# Patient Record
Sex: Male | Born: 1960 | Race: Black or African American | Hispanic: No | Marital: Married | Smoking: Former smoker
Health system: Southern US, Community
[De-identification: ages and names within clinical notes are randomized; demographics above are authoritative.]

## PROBLEM LIST (undated history)

## (undated) DIAGNOSIS — B1920 Unspecified viral hepatitis C without hepatic coma: Secondary | ICD-10-CM

---

## 2012-10-16 DIAGNOSIS — B182 Chronic viral hepatitis C: Secondary | ICD-10-CM | POA: Insufficient documentation

## 2012-10-16 DIAGNOSIS — R7303 Prediabetes: Secondary | ICD-10-CM | POA: Insufficient documentation

## 2013-01-09 DIAGNOSIS — E663 Overweight: Secondary | ICD-10-CM | POA: Insufficient documentation

## 2016-10-19 DIAGNOSIS — B009 Herpesviral infection, unspecified: Secondary | ICD-10-CM | POA: Insufficient documentation

## 2016-11-17 DIAGNOSIS — M7752 Other enthesopathy of left foot: Secondary | ICD-10-CM | POA: Insufficient documentation

## 2016-11-17 DIAGNOSIS — M722 Plantar fascial fibromatosis: Secondary | ICD-10-CM | POA: Insufficient documentation

## 2017-02-17 DIAGNOSIS — H25011 Cortical age-related cataract, right eye: Secondary | ICD-10-CM | POA: Insufficient documentation

## 2017-02-21 DIAGNOSIS — Z961 Presence of intraocular lens: Secondary | ICD-10-CM | POA: Insufficient documentation

## 2017-04-19 DIAGNOSIS — Q078 Other specified congenital malformations of nervous system: Secondary | ICD-10-CM | POA: Insufficient documentation

## 2017-07-22 DIAGNOSIS — Z9842 Cataract extraction status, left eye: Secondary | ICD-10-CM | POA: Insufficient documentation

## 2017-11-02 LAB — PSA: PSA: 0.37

## 2017-11-02 LAB — HM HIV SCREENING LAB: HM HIV SCREENING: NEGATIVE

## 2017-11-02 LAB — HEMOGLOBIN A1C: HEMOGLOBIN A1C: 6.4

## 2017-11-29 ENCOUNTER — Encounter: Payer: Self-pay | Admitting: Family Medicine

## 2017-11-29 ENCOUNTER — Ambulatory Visit (INDEPENDENT_AMBULATORY_CARE_PROVIDER_SITE_OTHER): Payer: BC Managed Care – PPO | Admitting: Family Medicine

## 2017-11-29 VITALS — BP 115/80 | HR 74 | Temp 97.8°F | Resp 16 | Ht 72.5 in | Wt 215.6 lb

## 2017-11-29 DIAGNOSIS — J3089 Other allergic rhinitis: Secondary | ICD-10-CM

## 2017-11-29 DIAGNOSIS — R7303 Prediabetes: Secondary | ICD-10-CM

## 2017-11-29 DIAGNOSIS — I10 Essential (primary) hypertension: Secondary | ICD-10-CM | POA: Insufficient documentation

## 2017-11-29 DIAGNOSIS — Z8619 Personal history of other infectious and parasitic diseases: Secondary | ICD-10-CM | POA: Insufficient documentation

## 2017-11-29 DIAGNOSIS — Z7689 Persons encountering health services in other specified circumstances: Secondary | ICD-10-CM | POA: Diagnosis not present

## 2017-11-29 DIAGNOSIS — J309 Allergic rhinitis, unspecified: Secondary | ICD-10-CM | POA: Insufficient documentation

## 2017-11-29 MED ORDER — AMLODIPINE BESYLATE 10 MG PO TABS: 10.0000 mg | ORAL_TABLET | Freq: Every day | ORAL | 1 refills | Status: DC

## 2017-11-29 NOTE — Assessment & Plan Note (Signed)
Previously elevated A1c up to 6.4, last checked 10/2017 by prior PCP  Plan:  1. Not on any therapy currently  2. Encourage improved lifestyle - low carb, low sugar diet, reduce portion size, continue improving regular exercise 3. Follow-up 4 months for PreDM A1c re-check, discuss goal A1c

## 2017-11-29 NOTE — Progress Notes (Signed)
Subjective:    Patient ID: Kyle Buchanan, male    DOB: Nov 27, 1960, 57 y.o.   MRN: [id number]  Kyle Buchanan is a 57 y.o. male presenting on 11/29/2017 for Establish Care and Hypertension  [facility]  HPI   CHRONIC HTN: Reports that he has a home BP cuff machine, his readings were lower in 120-130s on the medication. Relatively new diagnosis and treatment, he has had some elevated BP in past, now in past 1 month prior [facility] Doctor started him on Amlodipine for his BP. Current Meds - Amlodipine 10mg  daily - no other meds tried   Reports good compliance, took meds today. Tolerating well, w/o complaints. Admits very rare lightheaded or dizzy spell Denies CP, dyspnea, HA, edema  Pre-Diabetes Reports no concerns with "borderline sugar" in past 4 yr ago he went to a nutritionist, and has improved lifestyle and diet, asking if he can get this checked again. Last checked 10/2017 at previous PCP A1c 6.4, previous readings around 6.0 CBGs: Not checking CBG Meds: Never on med Currently not on ACEi / ARB Lifestyle: - Diet (Balanced diet, low salt, granola, yogurt, chicken, salad, he is weaning off soda now, on to more water) - Exercise (He does walking and elliptical machine) Denies hypoglycemia, polyuria, visual changes, numbness or tingling.  PMH - Allergic Rhinosinusitis - on Flonase with good results.  - History of Chronic Hepatitis C, diagnosed initially around 2003 based on chart review with lab results. He has had repeat testing in past.  Health Maintenance:  Colon CA Screening: Last Colonoscopy (done by [facility] on 10/27/10), results with negative result without polyp, good for up to 5-10 years. Currently asymptomatic. No known family history of colon CA.  - Will request copy of last colonoscopy  UTD routine HIV screen, negative 11/02/17 (outside lab - [facility])  UTD Tdap 04/29/2014 - [facility] outside  record.  Prostate CA Screening: Prior PSA / DRE reported normal. [facility] Currently asymptomatic without significant LUTS. No known family history of prostate CA.   Depression screen PHQ 2/9 11/29/2017  Decreased Interest 0  Down, Depressed, Hopeless 0  PHQ - 2 Score 0    History reviewed. No pertinent past medical history. History reviewed. No pertinent surgical history. Social History   Socioeconomic History  . Marital status: Married    Spouse name: Not on file  . Number of children: Not on file  . Years of education: Not on file  . Highest education level: Not on file  Occupational History  . Not on file  Social Needs  . Financial resource strain: Not on file  . Food insecurity:    Worry: Not on file    Inability: Not on file  . Transportation needs:    Medical: Not on file    Non-medical: Not on file  Tobacco Use  . Smoking status: Former Smoker    Packs/day: 1.00    Years: 9.00    Pack years: 9.00    Types: Cigarettes  . Smokeless tobacco: Former Engineer, waterUser  Substance and Sexual Activity  . Alcohol use: Not Currently    Comment: past  . Drug use: Not Currently    Types: Marijuana, Cocaine    Comment: past   . Sexual activity: Not on file  Lifestyle  . Physical activity:    Days per week: Not on file    Minutes per session: Not  on file  . Stress: Not on file  Relationships  . Social connections:    Talks on phone: Not on file    Gets together: Not on file    Attends religious service: Not on file    Active member of club or organization: Not on file    Attends meetings of clubs or organizations: Not on file    Relationship status: Not on file  . Intimate partner violence:    Fear of current or ex partner: Not on file    Emotionally abused: Not on file    Physically abused: Not on file    Forced sexual activity: Not on file  Other Topics Concern  . Not on file  Social History Narrative  . Not on file   Family  History  Problem Relation Age of Onset  . Cancer Father        bone cancer  . Cancer Sister        uncertain type   Current Outpatient Medications on File Prior to Visit  Medication Sig  . fluticasone (FLONASE) 50 MCG/ACT nasal spray Place into the nose.  . Multiple Vitamin (MULTI-VITAMINS) TABS Take by mouth.   No current facility-administered medications on file prior to visit.     Review of Systems Per HPI unless specifically indicated above     Objective:    BP 115/80   Pulse 74   Temp 97.8 F (36.6 C) (Oral)   Resp 16   Ht 6' 0.5" (1.842 m)   Wt 215 lb 9.6 oz (97.8 kg)   BMI 28.84 kg/m   Wt Readings from Last 3 Encounters:  11/29/17 215 lb 9.6 oz (97.8 kg)    Physical Exam  Constitutional: He is oriented to person, place, and time. He appears well-developed and well-nourished. No distress.  Well-appearing, comfortable, cooperative  HENT:  Mouth/Throat: Oropharynx is clear and moist.  Eyes: Conjunctivae are normal. Right eye exhibits no discharge. Left eye exhibits no discharge.  Neck: Normal range of motion.  Cardiovascular: Normal rate, regular rhythm, normal heart sounds and intact distal pulses.  No murmur heard. Pulmonary/Chest: Effort normal and breath sounds normal. No respiratory distress. He has no wheezes. He has no rales.  Musculoskeletal: Normal range of motion. He exhibits no edema.  Neurological: He is alert and oriented to person, place, and time.  Skin: Skin is warm and dry. No rash noted. He is not diaphoretic. No erythema.  Psychiatric: He has a normal mood and affect. His behavior is normal.  Well groomed, good eye contact, normal speech and thoughts  Nursing note and vitals reviewed.  Results for orders placed or performed in visit on 11/29/17  HM HIV SCREENING LAB  Result Value Ref Range   HM HIV Screening Negative - Validated   PSA  Result Value Ref Range   PSA 0.37   Hemoglobin A1c  Result Value Ref Range   Hemoglobin A1C 6.4        Assessment & Plan:   Problem List Items Addressed This Visit    Allergic rhinitis due to allergen    Stable without acute flare History of allergies, using Flonase regularly      Essential hypertension - Primary    Well-controlled HTN, relatively new diagnosis - Home BP readings normal  No known complications - last labs reviewed outside PCP 10/2017    Plan:  1. Continue current BP regimen - Amlodipine 10mg  daily 2. Encourage improved lifestyle - low sodium diet, regular exercise 3.  Continue monitor BP outside office, bring readings to next visit, if persistently >140/90 or new symptoms notify office sooner - also precaution if BP too low < 100 or if persistent dizzy / lightheadedness he may notify us we can consider REDUCE dose by half or 5mg  4. Follow-up 4 months for follow-up BP      Relevant Medications   amLODipine (NORVASC) 10 MG tablet   History of hepatitis C    Based on chart review, not specifically reported by patient. Will review chart and discuss at next visit, may need surveillance in future with [place]      Pre-diabetes    Previously elevated A1c up to 6.4, last checked 10/2017 by prior PCP  Plan:  1. Not on any therapy currently  2. Encourage improved lifestyle - low carb, low sugar diet, reduce portion size, continue improving regular exercise 3. Follow-up 4 months for PreDM A1c re-check, discuss goal A1c        Other Visit Diagnoses    Encounter to establish care with new doctor         Review outside records from [facility] Request Colonoscopy record from [facility] from 2012  Meds ordered this encounter  Medications  . amLODipine (NORVASC) 10 MG tablet    Sig: Take 1 tablet (10 mg total) by mouth daily.    Dispense:  90 tablet    Refill:  1    Follow up plan: Return in about 4 months (around 03/30/2018) for PreDM A1c, HTN med follow-up.  Saralyn Pilar, DO [facility] 11/29/2017,  1:37 PM

## 2017-11-29 NOTE — Assessment & Plan Note (Addendum)
Well-controlled HTN, relatively new diagnosis - Home BP readings normal  No known complications - last labs reviewed outside PCP 10/2017    Plan:  1. Continue current BP regimen - Amlodipine 10mg  daily 2. Encourage improved lifestyle - low sodium diet, regular exercise 3. Continue monitor BP outside office, bring readings to next visit, if persistently >140/90 or new symptoms notify office sooner - also precaution if BP too low < 100 or if persistent dizzy / lightheadedness he may notify us we can consider REDUCE dose by half or 5mg  4. Follow-up 4 months for follow-up BP

## 2017-11-29 NOTE — Patient Instructions (Addendum)
Thank you for coming to the office today.  Keep up the good work.  Refilled Amlodipine 10mg  daily - [facility]  Keep track of BP at home, write down readings, goal is < 140/90 - if too low < 100 and feel Dizzy or Lightheaded, may hold medicine and let me know.  Next visit we will check finger stick blood sugar - A1c - try to keep improving diet, limit soda and increase water. Continue exercise  Please schedule a Follow-up Appointment to: Return in about 4 months (around 03/30/2018) for PreDM A1c, HTN med follow-up.  If you have any other questions or concerns, please feel free to call the office or send a message through MyChart. You may also schedule an earlier appointment if necessary.  Additionally, you may be receiving a survey about your experience at our office within a few days to 1 week by e-mail or mail. We value your feedback.  Saralyn PilarAlexander Tonyetta Berko, DO [facility]

## 2017-11-29 NOTE — Assessment & Plan Note (Signed)
Stable without acute flare History of allergies, using Flonase regularly

## 2017-11-29 NOTE — Assessment & Plan Note (Signed)
Based on chart review, not specifically reported by patient. Will review chart and discuss at next visit, may need surveillance in future with [place]

## 2017-12-04 ENCOUNTER — Telehealth: Payer: Self-pay | Admitting: Family Medicine

## 2017-12-04 NOTE — Telephone Encounter (Signed)
Attempted to call patient today Monday 12/04/17 - phone went straight to voicemail. I saw patient on 11/29/17. The BP medication is Amlodipine 10mg  daily that was recently started by his previous doctor.  Please notify patient:  Yes, most likely ankle swelling is caused by his Amlodipine.  He should STOP med for about 3 days. Then may restart it at HALF dose - cut tablet in HALF for dose of 5mg  to see if this is any better tolerated.  He should keep track of his BP as well. If BP is well controlled at lower dose, goal BP < 140/90, then he should continue this until he follows up with me in March 2020.  He should let us know within 2 weeks if lower dose is better for him, then we can change his rx at pharmacy.  Saralyn PilarAlexander Gaberial Cada, DO [facility] 12/04/2017, 12:31 PM

## 2017-12-04 NOTE — Telephone Encounter (Signed)
Patient advised.

## 2017-12-04 NOTE — Telephone Encounter (Signed)
Pt's ankle is swelling and asked if it could be BP medication [phone]

## 2017-12-18 ENCOUNTER — Telehealth: Payer: Self-pay | Admitting: Family Medicine

## 2017-12-18 NOTE — Telephone Encounter (Signed)
Pt said BP medication is making his feet swell [phone]

## 2017-12-18 NOTE — Telephone Encounter (Signed)
Last seen 11/29/17 in office visit, see note. Also phone call on 12/04/17.  He called on 11/29 with still having foot/ankle swelling. On lower dose Amlodipine from 10 to 5mg .  I advised him now we can STOP Amlodipine due to swelling, and switch med, I offered losartan ARB. He and his wife have concerns that he may not need medication as they think previous doctors rushed the diagnosis of HTN, never had before, dx after 1 visit BP 160/100 in 10/2017.  I advised them that this is reasonable, and I checked his past BP readings almost all were in normal range otherwise, they think his BP was elevated in October 2019 at prior doctor due to coming straight from work/activity.  Now plan as of today 12/18/17 - he will STOP Amlodipine 5mg  daily. Remain off of all anti-HTN meds for now. They will check BP occasionally at pharmacy or home, write down readings, if controlled then no concerns.  He should schedule a sooner follow-up in 6 weeks to re-check BP and check foot swelling. Then we may re-schedule March 2020.  Saralyn PilarAlexander Karamalegos, DO [facility] 12/18/2017, 5:50 PM

## 2018-01-04 ENCOUNTER — Ambulatory Visit: Payer: BC Managed Care – PPO | Admitting: Family Medicine

## 2018-01-08 ENCOUNTER — Encounter: Payer: Self-pay | Admitting: Family Medicine

## 2018-01-08 ENCOUNTER — Ambulatory Visit (INDEPENDENT_AMBULATORY_CARE_PROVIDER_SITE_OTHER): Payer: BC Managed Care – PPO | Admitting: Family Medicine

## 2018-01-08 VITALS — BP 135/72 | HR 71 | Temp 98.4°F | Resp 16 | Ht 72.05 in | Wt 215.6 lb

## 2018-01-08 DIAGNOSIS — M7989 Other specified soft tissue disorders: Secondary | ICD-10-CM

## 2018-01-08 DIAGNOSIS — I10 Essential (primary) hypertension: Secondary | ICD-10-CM | POA: Diagnosis not present

## 2018-01-08 MED ORDER — LISINOPRIL 20 MG PO TABS: 20.0000 mg | ORAL_TABLET | Freq: Every day | ORAL | 1 refills | Status: DC

## 2018-01-08 NOTE — Patient Instructions (Addendum)
Thank you for coming to the office today.  New start Lisinopril 20mg  daily  Remain off Amlodipine  If you get a dry cough or lip or facial swelling - stop med and seek care immediately  Keep checking BP at home  DUE for NON FASTING BLOOD WORK   SCHEDULE "Lab Only" visit in the morning at the clinic for lab draw in 3 WEEKS   - Make sure Lab Only appointment is at about 1 week before your next appointment, so that results will be available  For Lab Results, once available within 2-3 days of blood draw, you can can log in to MyChart online to view your results and a brief explanation. Also, we can discuss results at next follow-up visit.   Please schedule a Follow-up Appointment to: Return in about 3 weeks (around 01/29/2018) for HTN.  If you have any other questions or concerns, please feel free to call the office or send a message through MyChart. You may also schedule an earlier appointment if necessary.  Additionally, you may be receiving a survey about your experience at our office within a few days to 1 week by e-mail or mail. We value your feedback.  Saralyn PilarAlexander Sohil Timko, DO [facility]

## 2018-01-08 NOTE — Progress Notes (Signed)
Subjective:    Patient ID: Kyle Buchanan, male    DOB: Oct 26, 1960, 57 y.o.   MRN: [id number]  Kyle Buchanan is a 57 y.o. male presenting on 01/08/2018 for Hypertension  Accompanied by wife, Casper HarrisonSheila Fickel, who also provides history.  HPI   CHRONIC HTN / Lower Extremity Edema - Last visit with me 11/29/17, for initial visit / establish care, focused on HTN problem, his previous doctor had just initiated him on Amlodipine 10mg  daily before, he developed significant bilateral foot lower extremity swelling and sensitivity, he was advised few days later 11/18 to cut amlodipine in half from 10 to 5mg , this did not help actually swelling increased, then few weeks later approx 12/2 he was advised to STOP Amlodipine completely, see prior notes for background information. - Interval update with now swelling is mostly resolved up to 90% better, but still has some sensitivity in Left foot residually, R foot is back to normal  Home BP readings previously on Amlodipine BP 120-130/70s, off med mostly normal 130/80, occasional up to 140/90  Today he reports interested in new BP medication if needed  Current Meds - None (off Amlodipine)    Denies CP, dyspnea, HA, edema, dizziness / lightheadedness   [facility] 01/08/2018 11/29/2017  Decreased Interest 0 0  Down, Depressed, Hopeless 0 0  PHQ - 2 Score 0 0    Social History   Tobacco Use  . Smoking status: Former Smoker    Packs/day: 1.00    Years: 9.00    Pack years: 9.00    Types: Cigarettes  . Smokeless tobacco: Former Engineer, waterUser  Substance Use Topics  . Alcohol use: Not Currently    Comment: past  . Drug use: Not Currently    Types: Marijuana, Cocaine    Comment: past     Review of Systems Per HPI unless specifically indicated above     Objective:    BP 135/72   Pulse 71   Temp 98.4 F (36.9 C) (Oral)   Resp 16   Ht 6' 0.05" (1.83 m)   Wt 215 lb 9.6 oz (97.8 kg)   BMI 29.20 kg/m   Wt Readings from  Last 3 Encounters:  01/08/18 215 lb 9.6 oz (97.8 kg)  11/29/17 215 lb 9.6 oz (97.8 kg)    Physical Exam Vitals signs and nursing note reviewed.  Constitutional:      General: He is not in acute distress.    Appearance: He is well-developed. He is not diaphoretic.     Comments: Well-appearing, comfortable, cooperative  HENT:     Head: Normocephalic and atraumatic.  Eyes:     General:        Right eye: No discharge.        Left eye: No discharge.     Conjunctiva/sclera: Conjunctivae normal.  Cardiovascular:     Rate and Rhythm: Normal rate and regular rhythm.     Pulses: Normal pulses.     Heart sounds: Normal heart sounds. No murmur.  Pulmonary:     Effort: Pulmonary effort is normal.  Musculoskeletal:        General: No tenderness.     Right lower leg: No edema.     Left lower leg: No edema.  Skin:    General: Skin is warm and dry.     Findings: No erythema or rash.     Comments: Mild callus formation bilateral feet  Neurological:     Mental Status: He is alert  and oriented to person, place, and time.     Comments: Monofilament testing normal bilateral feet  Psychiatric:        Behavior: Behavior normal.     Comments: Well groomed, good eye contact, normal speech and thoughts         Assessment & Plan:   Problem List Items Addressed This Visit    Essential hypertension - Primary    Today normal range BP, off medication - Home BP readings had been improved on Amlodipine, recent mild elevated No known complications - last labs reviewed outside PCP 10/2017 OFF Amlodipine 5-10mg  due to pedal edema    Plan:  1. Remain OFF Amlodipine 2. START new agent - Lisinopril 20mg  daily - reviewed ACEi risk benefit, potential side effect ACEi cough and angioedema 3. Encourage improved lifestyle - low sodium diet, regular exercise 4. Continue monitor BP outside office, bring readings to next visit, if persistently >140/90 or new symptoms notify office sooner 5. Follow-up 2-4  weeks for chemistry lab for Cr / K - then return 03/2018 for follow-up - he may keep apt for 01/29/18 if BP elevated - would likely increase ACEi vs add Thiazide if edema returns. Otherwise cancel apt 01/29/18 for office visit       Relevant Medications   lisinopril (PRINIVIL,ZESTRIL) 20 MG tablet   Other Relevant Orders   BASIC METABOLIC PANEL WITH GFR    Other Visit Diagnoses    Foot swelling         RESOLVED edema off Amlodipine - Still improving sensation of L foot - follow-up See A&P above  Meds ordered this encounter  Medications  . lisinopril (PRINIVIL,ZESTRIL) 20 MG tablet    Sig: Take 1 tablet (20 mg total) by mouth daily.    Dispense:  90 tablet    Refill:  1    Follow up plan: Return in about 3 weeks (around 01/29/2018) for HTN.  Future labs ordered for 01/29/18  Saralyn PilarAlexander Karamalegos, DO [facility] 01/08/2018, 9:13 AM

## 2018-01-08 NOTE — Assessment & Plan Note (Addendum)
Today normal range BP, off medication - Home BP readings had been improved on Amlodipine, recent mild elevated No known complications - last labs reviewed outside PCP 10/2017 OFF Amlodipine 5-10mg  due to pedal edema    Plan:  1. Remain OFF Amlodipine 2. START new agent - Lisinopril 20mg  daily - reviewed ACEi risk benefit, potential side effect ACEi cough and angioedema 3. Encourage improved lifestyle - low sodium diet, regular exercise 4. Continue monitor BP outside office, bring readings to next visit, if persistently >140/90 or new symptoms notify office sooner 5. Follow-up 2-4 weeks for chemistry lab for Cr / K - then return 03/2018 for follow-up - he may keep apt for 01/29/18 if BP elevated - would likely increase ACEi vs add Thiazide if edema returns. Otherwise cancel apt 01/29/18 for office visit

## 2018-01-15 ENCOUNTER — Other Ambulatory Visit: Payer: Self-pay

## 2018-01-15 NOTE — Telephone Encounter (Signed)
[facility] [place]

## 2018-01-16 MED ORDER — FLUTICASONE PROPIONATE 50 MCG/ACT NA SUSP: 2.0000 | Freq: Every day | NASAL | 6 refills | Status: DC

## 2018-01-16 NOTE — Addendum Note (Signed)
Addended by: Elvina MattesPATEL, Kandra Graven D on: 01/16/2018 09:27 AM   Modules accepted: Orders

## 2018-01-29 ENCOUNTER — Other Ambulatory Visit: Payer: BC Managed Care – PPO

## 2018-01-29 ENCOUNTER — Ambulatory Visit: Payer: BC Managed Care – PPO | Admitting: Family Medicine

## 2018-01-29 DIAGNOSIS — I10 Essential (primary) hypertension: Secondary | ICD-10-CM

## 2018-01-30 LAB — BASIC METABOLIC PANEL WITH GFR
BUN: 21 mg/dL (ref 7–25)
CO2: 24 mmol/L (ref 20–32)
Calcium: 9.4 mg/dL (ref 8.6–10.3)
Chloride: 106 mmol/L (ref 98–110)
Creat: 0.89 mg/dL (ref 0.70–1.33)
GFR, EST AFRICAN AMERICAN: 109 mL/min/{1.73_m2} (ref >=60)
GFR, Est Non African American: 94 mL/min/{1.73_m2} (ref >=60)
Glucose, Bld: 119 mg/dL (ref 65–139)
Potassium: 4.4 mmol/L (ref 3.5–5.3)
Sodium: 136 mmol/L (ref 135–146)

## 2018-02-15 ENCOUNTER — Telehealth: Payer: Self-pay | Admitting: Family Medicine

## 2018-02-15 NOTE — Telephone Encounter (Signed)
Pt asked about cutting back the dosage of lisinopril [phone]

## 2018-02-16 NOTE — Telephone Encounter (Signed)
Spoke with patient on why he wanted to cut back on lisinopril.  He said he has been having nausea.  However, his BP reading yesterday was 120/71.  He then said he thinks it might be the Flonase.  He take Flonase around 4:00om. Please advise

## 2018-02-16 NOTE — Telephone Encounter (Signed)
Please notify patient  I think it may be Flonase as well.  He should STOP Flonase to see if nausea improves.  He may continue his Lisinopril for now. It looks like he missed last apt in 01/2018. He is scheduled for Monday. I will discuss Lisinopril with him further on Monday.  Saralyn Pilar, DO [facility] 02/16/2018, 1:56 PM

## 2018-02-16 NOTE — Telephone Encounter (Signed)
LM for patient to call to get more details

## 2018-02-16 NOTE — Telephone Encounter (Signed)
Patient notified

## 2018-02-19 ENCOUNTER — Encounter: Payer: Self-pay | Admitting: Family Medicine

## 2018-02-19 ENCOUNTER — Ambulatory Visit (INDEPENDENT_AMBULATORY_CARE_PROVIDER_SITE_OTHER): Payer: BC Managed Care – PPO | Admitting: Family Medicine

## 2018-02-19 DIAGNOSIS — A6000 Herpesviral infection of urogenital system, unspecified: Secondary | ICD-10-CM

## 2018-02-19 MED ORDER — VALACYCLOVIR HCL 1 G PO TABS: 1000.0000 mg | ORAL_TABLET | Freq: Every day | ORAL | 1 refills | Status: DC | PRN

## 2018-02-19 NOTE — Progress Notes (Signed)
Subjective:    Patient ID: Kyle Buchanan, male    DOB: [birth year], 59 y.o.   MRN: [id number]  BARTT HA is a 58 y.o. male presenting on 02/19/2018 for Rash (onset 2 weeks)   HPI   Recurrent Genital HSV Reports chronic problem now for years with occasional outbreak with genital lesion / rash about 1-2 x a year approx, different locations on penis will have vesicle vs blister that develop and heals over 2 weeks usually, in past he took acyclovir PRN only with flare, he has been out of this now for while, would like to restart this med PRN. - Current flare has been present 2 weeks now, with similar skin blister rash that is healing and drying up. He has not taken any medicine for it recently - Denies any drainage, redness swelling ulceration or other problem   Depression screen [facility] 2/9 02/19/2018 01/08/2018 11/29/2017  Decreased Interest 0 0 0  Down, Depressed, Hopeless 0 0 0  PHQ - 2 Score 0 0 0    Social History   Tobacco Use  . Smoking status: Former Smoker    Packs/day: 1.00    Years: 9.00    Pack years: 9.00    Types: Cigarettes  . Smokeless tobacco: Former Engineer, water Use Topics  . Alcohol use: Not Currently    Comment: past  . Drug use: Not Currently    Types: Marijuana, Cocaine    Comment: past     Review of Systems Per HPI unless specifically indicated above     Objective:    BP 118/81   Pulse 73   Temp 97.7 F (36.5 C) (Oral)   Resp 16   Ht 6' (1.829 m)   Wt 214 lb (97.1 kg)   BMI 29.02 kg/m   Wt Readings from Last 3 Encounters:  02/19/18 214 lb (97.1 kg)  01/08/18 215 lb 9.6 oz (97.8 kg)  11/29/17 215 lb 9.6 oz (97.8 kg)    Physical Exam Vitals signs and nursing note reviewed.  Constitutional:      General: He is not in acute distress.    Appearance: He is well-developed. He is not diaphoretic.     Comments: Well-appearing, comfortable, cooperative  Cardiovascular:     Rate and Rhythm: Normal rate.  Pulmonary:     Effort:  Pulmonary effort is normal.  Genitourinary:    Comments: Male genital exam - penis with localized area on lower aspect of shaft with 1-2 cm now healing dried vesicular lesion without active vesicle or blister, has slight ulceration of skin that is healing as well Skin:    General: Skin is warm and dry.     Findings: No erythema or rash.  Neurological:     Mental Status: He is alert and oriented to person, place, and time.  Psychiatric:        Behavior: Behavior normal.     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 valACYclovir (VALTREX) 1000 MG tablet    Sig: Take 1 tablet (1,000 mg total) by mouth daily as needed. For flare up for 5 days then stop    Dispense:  20 tablet    Refill:  1     Follow up plan: Return if symptoms worsen or fail to improve.   Saralyn Pilar, DO [facility] 02/19/2018, 4:32 PM

## 2018-02-19 NOTE — Patient Instructions (Addendum)
Thank you for coming to the office today.  For recurrent rash you can start Valtrex take one pill every day for 5 days if develop first onset rash - if does not resolve can repeat again within 5 days.  If you have too many flare up >4 per year then notify office.  Please schedule a Follow-up Appointment to: Return if symptoms worsen or fail to improve.  If you have any other questions or concerns, please feel free to call the office or send a message through MyChart. You may also schedule an earlier appointment if necessary.  Additionally, you may be receiving a survey about your experience at our office within a few days to 1 week by e-mail or mail. We value your feedback.  Saralyn Pilar, DO [facility], [place]

## 2018-03-28 ENCOUNTER — Ambulatory Visit: Payer: BC Managed Care – PPO | Admitting: Family Medicine

## 2018-04-10 ENCOUNTER — Other Ambulatory Visit: Payer: Self-pay | Admitting: Family Medicine

## 2018-04-10 DIAGNOSIS — R7303 Prediabetes: Secondary | ICD-10-CM

## 2018-04-11 ENCOUNTER — Other Ambulatory Visit: Payer: Self-pay

## 2018-04-11 ENCOUNTER — Other Ambulatory Visit: Payer: BC Managed Care – PPO

## 2018-04-11 ENCOUNTER — Ambulatory Visit: Payer: BC Managed Care – PPO | Admitting: Family Medicine

## 2018-04-11 DIAGNOSIS — R7303 Prediabetes: Secondary | ICD-10-CM

## 2018-04-12 LAB — HEMOGLOBIN A1C
Hgb A1c MFr Bld: 6.3 % of total Hgb — ABNORMAL HIGH (ref <5.7)
MEAN PLASMA GLUCOSE: 134 (calc)
eAG (mmol/L): 7.4 (calc)

## 2018-04-16 ENCOUNTER — Ambulatory Visit (INDEPENDENT_AMBULATORY_CARE_PROVIDER_SITE_OTHER): Payer: BC Managed Care – PPO | Admitting: Family Medicine

## 2018-04-16 ENCOUNTER — Other Ambulatory Visit: Payer: Self-pay | Admitting: Family Medicine

## 2018-04-16 ENCOUNTER — Encounter: Payer: Self-pay | Admitting: Family Medicine

## 2018-04-16 ENCOUNTER — Other Ambulatory Visit: Payer: Self-pay

## 2018-04-16 DIAGNOSIS — R202 Paresthesia of skin: Secondary | ICD-10-CM

## 2018-04-16 DIAGNOSIS — R7303 Prediabetes: Secondary | ICD-10-CM

## 2018-04-16 DIAGNOSIS — R200 Anesthesia of skin: Secondary | ICD-10-CM

## 2018-04-16 DIAGNOSIS — Z0000 Encounter for general adult medical examination without abnormal findings: Secondary | ICD-10-CM

## 2018-04-16 DIAGNOSIS — I10 Essential (primary) hypertension: Secondary | ICD-10-CM

## 2018-04-16 NOTE — Assessment & Plan Note (Signed)
Improved A1c from 6.4 down to 6.3 now, improved lifestyle diet  Plan:  1. Not on any therapy currently  2. Encourage improved lifestyle - low carb, low sugar diet, reduce portion size, continue improving regular exercise 3. Follow-up 4 months Annual phys and labs

## 2018-04-16 NOTE — Progress Notes (Signed)
Virtual Visit via Telephone The purpose of this virtual visit is to provide medical care while limiting exposure to the novel coronavirus (COVID19) for both patient and office staff.  Consent was obtained for phone visit:  Yes.   Answered questions that patient had about telehealth interaction:  Yes.   I discussed the limitations, risks, security and privacy concerns of performing an evaluation and management service by telephone. I also discussed with the patient that there may be a patient responsible charge related to this service. The patient expressed understanding and agreed to proceed.  Patient Location: Home Provider Location: [facility] (Office)  ---------------------------------------------------------------------- CC: Pre-Diabetes, Left Thumb sensation  S: Reviewed CMA telephone note below. I have called patient and gathered additional HPI as follows:  Pre-Diabetes Last visit 11/2017 for same complaint, has persistent issue with A1c >6 in past, last was 6.4, now recently he had lab draw for A1c down to 6.3 CBGs: Not checking CBG Meds: Never on med Currently not on ACEi / ARB Lifestyle: - Diet (Improved diet again with reduced sugar, focus on healthier options, low carb, chicken, salad, granola) - Exercise (He does walking and elliptical machine) Denies hypoglycemia, polyuria, visual changes, numbness or tingling.  Left Thumb, abnormal sensation Reports symptoms started 2-3 week ago, without injury. He reports new problem with episodic "weird" sensation within thumb. Not tried any medicine or wrist splint. He attributes some of this to wearing specific smaller gloves at work may be compressing his thumb He denies any pain or swelling thumb.  Denies any fevers, chills, sweats, body ache, cough, shortness of breath, headache, abdominal pain, diarrhea  History reviewed. No pertinent past medical history. Social History   Tobacco Use  . Smoking status: Former  Smoker    Packs/day: 1.00    Years: 9.00    Pack years: 9.00    Types: Cigarettes  . Smokeless tobacco: Former Engineer, water Use Topics  . Alcohol use: Not Currently    Comment: past  . Drug use: Not Currently    Types: Marijuana, Cocaine    Comment: past     Current Outpatient Medications:  .  lisinopril (PRINIVIL,ZESTRIL) 20 MG tablet, Take 1 tablet (20 mg total) by mouth daily., Disp: 90 tablet, Rfl: 1 .  Multiple Vitamin (MULTI-VITAMINS) TABS, Take by mouth., Disp: , Rfl:  .  valACYclovir (VALTREX) 1000 MG tablet, Take 1 tablet (1,000 mg total) by mouth daily as needed. For flare up for 5 days then stop (Patient not taking: Reported on 04/16/2018), Disp: 20 tablet, Rfl: 1  Depression screen [facility] 2/9 04/16/2018 02/19/2018 01/08/2018  Decreased Interest 0 0 0  Down, Depressed, Hopeless 0 0 0  PHQ - 2 Score 0 0 0    No flowsheet data found.  -------------------------------------------------------------------------- O: No physical exam performed due to remote telephone encounter.  Lab results reviewed.  Recent Labs    11/02/17 04/11/18 0807  HGBA1C 6.4 6.3*    Recent Results (from the past 2160 hour(s))  BASIC METABOLIC PANEL WITH GFR     Status: None   Collection Time: 01/29/18  8:11 AM  Result Value Ref Range   Glucose, Bld 119 65 - 139 mg/dL    Comment: .        Non-fasting reference interval .    BUN 21 7 - 25 mg/dL   Creat 3.81 8.29 - 9.37 mg/dL    Comment: For patients >17 years of age, the reference limit for Creatinine is approximately 13% higher for  people identified as African-American. .    GFR, Est Non African American 94 > OR = 60 mL/min/1.61m2   GFR, Est African American 109 > OR = 60 mL/min/1.36m2   BUN/Creatinine Ratio NOT APPLICABLE 6 - 22 (calc)   Sodium 136 135 - 146 mmol/L   Potassium 4.4 3.5 - 5.3 mmol/L   Chloride 106 98 - 110 mmol/L   CO2 24 20 - 32 mmol/L   Calcium 9.4 8.6 - 10.3 mg/dL  Hemoglobin Z6X     Status: Abnormal    Collection Time: 04/11/18  8:07 AM  Result Value Ref Range   Hgb A1c MFr Bld 6.3 (H) <5.7 % of total Hgb    Comment: For someone without known diabetes, a hemoglobin  A1c value between 5.7% and 6.4% is consistent with prediabetes and should be confirmed with a  follow-up test. . For someone with known diabetes, a value <7% indicates that their diabetes is well controlled. A1c targets should be individualized based on duration of diabetes, age, comorbid conditions, and other considerations. . This assay result is consistent with an increased risk of diabetes. . Currently, no consensus exists regarding use of hemoglobin A1c for diagnosis of diabetes for children. .    Mean Plasma Glucose 134 (calc)   eAG (mmol/L) 7.4 (calc)    -------------------------------------------------------------------------- A&P:  Problem List Items Addressed This Visit    Pre-diabetes - Primary    Improved A1c from 6.4 down to 6.3 now, improved lifestyle diet  Plan:  1. Not on any therapy currently  2. Encourage improved lifestyle - low carb, low sugar diet, reduce portion size, continue improving regular exercise 3. Follow-up 4 months Annual phys and labs       Other Visit Diagnoses    Numbness and tingling of left thumb        Uncertain etiology, may be compression with gloves at work No pain or other associated features. No injury  Reassurance Avoid compression/overuse May use ice / compression elevation Try Thumb Spica Splint during work and at night Follow-up PRN  No orders of the defined types were placed in this encounter.   Follow-up: - Return in 4 months for annual physical - Future labs ordered for 08/13/18  Patient verbalizes understanding with the above medical recommendations including the limitation of remote medical advice.  Specific follow-up and call-back criteria were given for patient to follow-up or seek medical care more urgently if needed.   - Time spent in  direct consultation with patient on phone: 11 minutes  Saralyn Pilar, DO [facility] 04/16/2018, 9:05 AM

## 2018-04-16 NOTE — Patient Instructions (Addendum)
Recent Labs    11/02/17 04/11/18 0807  HGBA1C 6.4 6.3*   Try Thumb Spica Splint for left thumb - during work and overnight  DUE for FASTING BLOOD WORK (no food or drink after midnight before the lab appointment, only water or coffee without cream/sugar on the morning of)  SCHEDULE "Lab Only" visit in the morning at the clinic for lab draw in 4 MONTHS   - Make sure Lab Only appointment is at about 1 week before your next appointment, so that results will be available  For Lab Results, once available within 2-3 days of blood draw, you can can log in to MyChart online to view your results and a brief explanation. Also, we can discuss results at next follow-up visit.   If you have any other questions or concerns, please feel free to call the office or send a message through MyChart. You may also schedule an earlier appointment if necessary.  Additionally, you may be receiving a survey about your experience at our office within a few days to 1 week by e-mail or mail. We value your feedback.  Saralyn Pilar, DO [facility], [place]

## 2018-06-18 ENCOUNTER — Other Ambulatory Visit: Payer: Self-pay | Admitting: Family Medicine

## 2018-06-18 DIAGNOSIS — I10 Essential (primary) hypertension: Secondary | ICD-10-CM

## 2018-08-13 ENCOUNTER — Other Ambulatory Visit: Payer: BC Managed Care – PPO

## 2018-08-13 ENCOUNTER — Other Ambulatory Visit: Payer: Self-pay

## 2018-08-13 DIAGNOSIS — R7303 Prediabetes: Secondary | ICD-10-CM

## 2018-08-13 DIAGNOSIS — Z0000 Encounter for general adult medical examination without abnormal findings: Secondary | ICD-10-CM

## 2018-08-13 DIAGNOSIS — I10 Essential (primary) hypertension: Secondary | ICD-10-CM

## 2018-08-14 LAB — COMPLETE METABOLIC PANEL WITH GFR
AG Ratio: 1.5 (calc) (ref 1.0–2.5)
ALT: 34 U/L (ref 9–46)
AST: 31 U/L (ref 10–35)
Albumin: 4.4 g/dL (ref 3.6–5.1)
Alkaline phosphatase (APISO): 45 U/L (ref 35–144)
BUN: 20 mg/dL (ref 7–25)
CO2: 24 mmol/L (ref 20–32)
Calcium: 9.2 mg/dL (ref 8.6–10.3)
Chloride: 102 mmol/L (ref 98–110)
Creat: 1.08 mg/dL (ref 0.70–1.33)
GFR, Est African American: 87 mL/min/{1.73_m2} (ref >=60)
GFR, Est Non African American: 75 mL/min/{1.73_m2} (ref >=60)
Globulin: 2.9 g/dL (calc) (ref 1.9–3.7)
Glucose, Bld: 115 mg/dL — ABNORMAL HIGH (ref 65–99)
Potassium: 4.4 mmol/L (ref 3.5–5.3)
Sodium: 137 mmol/L (ref 135–146)
Total Bilirubin: 0.6 mg/dL (ref 0.2–1.2)
Total Protein: 7.3 g/dL (ref 6.1–8.1)

## 2018-08-14 LAB — CBC WITH DIFFERENTIAL/PLATELET
Absolute Monocytes: 353 cells/uL (ref 200–950)
Basophils Absolute: 20 cells/uL (ref 0–200)
Basophils Relative: 0.6 %
Eosinophils Absolute: 294 cells/uL (ref 15–500)
Eosinophils Relative: 8.9 %
HCT: 43.8 % (ref 38.5–50.0)
Hemoglobin: 14.2 g/dL (ref 13.2–17.1)
Lymphs Abs: 1442 cells/uL (ref 850–3900)
MCH: 27.2 pg (ref 27.0–33.0)
MCHC: 32.4 g/dL (ref 32.0–36.0)
MCV: 83.9 fL (ref 80.0–100.0)
MPV: 9 fL (ref 7.5–12.5)
Monocytes Relative: 10.7 %
Neutro Abs: 1191 cells/uL — ABNORMAL LOW (ref 1500–7800)
Neutrophils Relative %: 36.1 %
Platelets: 202 10*3/uL (ref 140–400)
RBC: 5.22 10*6/uL (ref 4.20–5.80)
RDW: 13.4 % (ref 11.0–15.0)
Total Lymphocyte: 43.7 %
WBC: 3.3 10*3/uL — ABNORMAL LOW (ref 3.8–10.8)

## 2018-08-14 LAB — LIPID PANEL
Cholesterol: 167 mg/dL (ref <200)
HDL: 59 mg/dL (ref >=40)
LDL Cholesterol (Calc): 88 mg/dL (calc)
Non-HDL Cholesterol (Calc): 108 mg/dL (calc) (ref <130)
Total CHOL/HDL Ratio: 2.8 (calc) (ref <5.0)
Triglycerides: 103 mg/dL (ref <150)

## 2018-08-14 LAB — PSA: PSA: 0.5 ng/mL (ref <=4.0)

## 2018-08-14 LAB — HEMOGLOBIN A1C
Hgb A1c MFr Bld: 6.2 % of total Hgb — ABNORMAL HIGH (ref <5.7)
Mean Plasma Glucose: 131 (calc)
eAG (mmol/L): 7.3 (calc)

## 2018-08-17 ENCOUNTER — Encounter: Payer: BC Managed Care – PPO | Admitting: Family Medicine

## 2018-08-24 ENCOUNTER — Ambulatory Visit (INDEPENDENT_AMBULATORY_CARE_PROVIDER_SITE_OTHER): Payer: BC Managed Care – PPO | Admitting: Family Medicine

## 2018-08-24 ENCOUNTER — Other Ambulatory Visit: Payer: Self-pay

## 2018-08-24 ENCOUNTER — Encounter: Payer: Self-pay | Admitting: Family Medicine

## 2018-08-24 VITALS — BP 129/81 | HR 62 | Temp 98.0°F | Resp 16 | Ht 72.0 in | Wt 216.8 lb

## 2018-08-24 DIAGNOSIS — R7303 Prediabetes: Secondary | ICD-10-CM | POA: Diagnosis not present

## 2018-08-24 DIAGNOSIS — Z1211 Encounter for screening for malignant neoplasm of colon: Secondary | ICD-10-CM | POA: Diagnosis not present

## 2018-08-24 DIAGNOSIS — Z0000 Encounter for general adult medical examination without abnormal findings: Secondary | ICD-10-CM

## 2018-08-24 DIAGNOSIS — I10 Essential (primary) hypertension: Secondary | ICD-10-CM | POA: Diagnosis not present

## 2018-08-24 NOTE — Assessment & Plan Note (Signed)
Controlled BP Home BP normal No known complications OFF Amlodipine 5-10mg  due to pedal edema    Plan:  1. Continue Lisinopril 20mg  daily 2. Encourage improved lifestyle - low sodium diet, regular exercise 3. Continue monitor BP outside office, bring readings to next visit, if persistently >140/90 or new symptoms notify office sooner

## 2018-08-24 NOTE — Patient Instructions (Addendum)
Thank you for coming to the office today.  1. Chemistry - Normal results, including electrolytes, kidney and liver function.  - Mild elevated fasting sugar.   2. Hemoglobin A1c (Pre-Diabetes) - 6.2, improved from prior 6.3-6.4   3. PSA Prostate Cancer Screening - 0.5, negative.   4. CBC Blood Counts - Mild low WBC 3.3. no comparison. No anemia.   5. Cholesterol - Normal cholesterol results.   Colon Cancer Screening: - For all adults age 59+ routine colon cancer screening is highly recommended.     - Recent guidelines from [place] recommend starting age of 16 - Early detection of colon cancer is important, because often there are no warning signs or symptoms, also if found early usually it can be cured. Late stage is hard to treat.  - If you are not interested in Colonoscopy screening (if done and normal you could be cleared for 5 to 10 years until next due), then Cologuard is an excellent alternative for screening test for Colon Cancer. It is highly sensitive for detecting DNA of colon cancer from even the earliest stages. Also, there is NO bowel prep required. - If Cologuard is NEGATIVE, then it is good for 3 years before next due - If Cologuard is POSITIVE, then it is strongly advised to get a Colonoscopy, which allows the GI doctor to locate the source of the cancer or polyp (even very early stage) and treat it by removing it. ------------------------- If you would like to proceed with Cologuard (stool DNA test)  Ordered today  Follow instructions to collect sample, you may call the company for any help or questions, 24/7 telephone support at [phone].    Please schedule a Follow-up Appointment to: Return in about 6 months (around 02/24/2019) for 6 month follow-up PreDM A1c, HTN.  If you have any other questions or concerns, please feel free to call the office or send a message through [place]. You may also schedule an earlier appointment if  necessary.  Additionally, you may be receiving a survey about your experience at our office within a few days to 1 week by e-mail or mail. We value your feedback.  Kyle Putnam, DO [place]

## 2018-08-24 NOTE — Progress Notes (Signed)
Subjective:    Patient ID: Kyle Buchanan, male    DOB: [birth year], 59 y.o.   MRN: [id number]  Kyle Buchanan is a 58 y.o. male presenting on 08/24/2018 for Annual Exam   HPI   Here for Annual Physical and Lab Review.  Pre-Diabetes Last 03/2018, A1c now down to 6.2, from 6.3-6.4 Improving lifestyle CBGs:Not checking CBG Meds:Never on med Currentlynot onACEi / ARB Lifestyle: - Weight stable, plus minus 1 lb - Diet (Improved diet, healthy options) - Exercise (He does walking and elliptical machine and other exercises regularly) Denies hypoglycemia, polyuria, visual changes, numbness or tingling.  CHRONIC HTN / Lower Extremity Edema Previously on Amlodipine but it caused LE edema. Now off med doing well. See prior notes for background information. Today he is doing well, BP is controlled on Lisinopril Taking Lisinopril 20mg  daily Reports good compliance, took meds today. Tolerating well, w/o complaints. Denies CP, dyspnea, HA, edema, dizziness / lightheadedness  HYPERLIPIDEMIA: - Reports no concerns. Last lipid panel 07/2018, controlled on lifestyle   Health Maintenance:  Colon CA Screening: Last Colonoscopy (done by [facility] on 10/27/10), results with negative result without polyp, good for up to 5-10 years. Currently asymptomatic. No known family history of colon CA.  - Order Cologuard today.  UTD routine HIV screen, negative 11/02/17 (outside lab - [place])  UTD Tdap 04/29/2014 - [place] outside record.  Prostate CA Screening: Prior PSA / DRE reported normal. Last PSA 0.5 (07/2018). Currently asymptomatic without significant LUTS. No known family history of prostate CA.   Depression screen [facility] 2/9 08/24/2018 04/16/2018 02/19/2018  Decreased Interest 0 0 0  Down, Depressed, Hopeless 0 0 0  PHQ - 2 Score 0 0 0    History reviewed. No pertinent past medical history. History reviewed. No pertinent surgical history. Social History   Socioeconomic  History  . Marital status: Married    Spouse name: Not on file  . Number of children: Not on file  . Years of education: Not on file  . Highest education level: Not on file  Occupational History  . Not on file  Social Needs  . Financial resource strain: Not on file  . Food insecurity    Worry: Not on file    Inability: Not on file  . Transportation needs    Medical: Not on file    Non-medical: Not on file  Tobacco Use  . Smoking status: Former Smoker    Packs/day: 1.00    Years: 9.00    Pack years: 9.00    Types: Cigarettes  . Smokeless tobacco: Former Network engineer and Sexual Activity  . Alcohol use: Not Currently    Comment: past  . Drug use: Not Currently    Types: Marijuana, Cocaine    Comment: past   . Sexual activity: Not on file  Lifestyle  . Physical activity    Days per week: Not on file    Minutes per session: Not on file  . Stress: Not on file  Relationships  . Social Herbalist on phone: Not on file    Gets together: Not on file    Attends religious service: Not on file    Active member of club or organization: Not on file    Attends meetings of clubs or organizations: Not on file    Relationship status: Not on file  . Intimate partner violence    Fear of current or ex partner: Not on file  Emotionally abused: Not on file    Physically abused: Not on file    Forced sexual activity: Not on file  Other Topics Concern  . Not on file  Social History Narrative  . Not on file   Family History  Problem Relation Age of Onset  . Cancer Father        bone cancer  . Cancer Sister        uncertain type   Current Outpatient Medications on File Prior to Visit  Medication Sig  . lisinopril (ZESTRIL) 20 MG tablet TAKE 1 TABLET BY MOUTH EVERY DAY  . Multiple Vitamin (MULTI-VITAMINS) TABS Take by mouth.  . valACYclovir (VALTREX) 1000 MG tablet Take 1 tablet (1,000 mg total) by mouth daily as needed. For flare up for 5 days then stop   No  current facility-administered medications on file prior to visit.     Review of Systems  Constitutional: Negative for activity change, appetite change, chills, diaphoresis, fatigue and fever.  HENT: Negative for congestion and hearing loss.   Eyes: Negative for visual disturbance.  Respiratory: Negative for apnea, cough, chest tightness, shortness of breath and wheezing.   Cardiovascular: Negative for chest pain, palpitations and leg swelling.  Gastrointestinal: Negative for abdominal pain, anal bleeding, blood in stool, constipation, diarrhea, nausea and vomiting.  Endocrine: Negative for cold intolerance.  Genitourinary: Negative for decreased urine volume, difficulty urinating, dysuria, frequency and hematuria.  Musculoskeletal: Negative for arthralgias, back pain and neck pain.  Skin: Negative for rash.  Allergic/Immunologic: Negative for environmental allergies.  Neurological: Negative for dizziness, weakness, light-headedness, numbness and headaches.  Hematological: Negative for adenopathy.  Psychiatric/Behavioral: Negative for behavioral problems, dysphoric mood and sleep disturbance. The patient is not nervous/anxious.    Per HPI unless specifically indicated above      Objective:    BP 129/81   Pulse 62   Temp 98 F (36.7 C) (Oral)   Resp 16   Ht 6' (1.829 m)   Wt 216 lb 12.8 oz (98.3 kg)   BMI 29.40 kg/m   Wt Readings from Last 3 Encounters:  08/24/18 216 lb 12.8 oz (98.3 kg)  02/19/18 214 lb (97.1 kg)  01/08/18 215 lb 9.6 oz (97.8 kg)    Physical Exam Vitals signs and nursing note reviewed.  Constitutional:      General: He is not in acute distress.    Appearance: He is well-developed. He is not diaphoretic.     Comments: Well-appearing, comfortable, cooperative  HENT:     Head: Normocephalic and atraumatic.  Eyes:     General:        Right eye: No discharge.        Left eye: No discharge.     Conjunctiva/sclera: Conjunctivae normal.     Pupils: Pupils  are equal, round, and reactive to light.  Neck:     Musculoskeletal: Normal range of motion and neck supple.     Thyroid: No thyromegaly.  Cardiovascular:     Rate and Rhythm: Normal rate and regular rhythm.     Heart sounds: Normal heart sounds. No murmur.  Pulmonary:     Effort: Pulmonary effort is normal. No respiratory distress.     Breath sounds: Normal breath sounds. No wheezing or rales.  Abdominal:     General: Bowel sounds are normal. There is no distension.     Palpations: Abdomen is soft. There is no mass.     Tenderness: There is no abdominal tenderness.  Musculoskeletal: Normal range  of motion.        General: No tenderness.     Comments: Upper / Lower Extremities: - Normal muscle tone, strength bilateral upper extremities 5/5, lower extremities 5/5  Lymphadenopathy:     Cervical: No cervical adenopathy.  Skin:    General: Skin is warm and dry.     Findings: No erythema or rash.  Neurological:     Mental Status: He is alert and oriented to person, place, and time.     Comments: Distal sensation intact to light touch all extremities  Psychiatric:        Behavior: Behavior normal.     Comments: Well groomed, good eye contact, normal speech and thoughts    Results for orders placed or performed in visit on 08/13/18  Lipid panel  Result Value Ref Range   Cholesterol 167 <200 mg/dL   HDL 59 > OR = 40 mg/dL   Triglycerides 161103 <096<150 mg/dL   LDL Cholesterol (Calc) 88 mg/dL (calc)   Total CHOL/HDL Ratio 2.8 <5.0 (calc)   Non-HDL Cholesterol (Calc) 108 <130 mg/dL (calc)  COMPLETE METABOLIC PANEL WITH GFR  Result Value Ref Range   Glucose, Bld 115 (H) 65 - 99 mg/dL   BUN 20 7 - 25 mg/dL   Creat 0.451.08 4.090.70 - 8.111.33 mg/dL   GFR, Est Non African American 75 > OR = 60 mL/min/1.3673m2   GFR, Est African American 87 > OR = 60 mL/min/1.1673m2   BUN/Creatinine Ratio NOT APPLICABLE 6 - 22 (calc)   Sodium 137 135 - 146 mmol/L   Potassium 4.4 3.5 - 5.3 mmol/L   Chloride 102 98 - 110  mmol/L   CO2 24 20 - 32 mmol/L   Calcium 9.2 8.6 - 10.3 mg/dL   Total Protein 7.3 6.1 - 8.1 g/dL   Albumin 4.4 3.6 - 5.1 g/dL   Globulin 2.9 1.9 - 3.7 g/dL (calc)   AG Ratio 1.5 1.0 - 2.5 (calc)   Total Bilirubin 0.6 0.2 - 1.2 mg/dL   Alkaline phosphatase (APISO) 45 35 - 144 U/L   AST 31 10 - 35 U/L   ALT 34 9 - 46 U/L  PSA  Result Value Ref Range   PSA 0.5 < OR = 4.0 ng/mL  CBC with Differential/Platelet  Result Value Ref Range   WBC 3.3 (L) 3.8 - 10.8 Thousand/uL   RBC 5.22 4.20 - 5.80 Million/uL   Hemoglobin 14.2 13.2 - 17.1 g/dL   HCT 91.443.8 78.238.5 - 95.650.0 %   MCV 83.9 80.0 - 100.0 fL   MCH 27.2 27.0 - 33.0 pg   MCHC 32.4 32.0 - 36.0 g/dL   RDW 21.313.4 08.611.0 - 57.815.0 %   Platelets 202 140 - 400 Thousand/uL   MPV 9.0 7.5 - 12.5 fL   Neutro Abs 1,191 (L) 1,500 - 7,800 cells/uL   Lymphs Abs 1,442 850 - 3,900 cells/uL   Absolute Monocytes 353 200 - 950 cells/uL   Eosinophils Absolute 294 15 - 500 cells/uL   Basophils Absolute 20 0 - 200 cells/uL   Neutrophils Relative % 36.1 %   Total Lymphocyte 43.7 %   Monocytes Relative 10.7 %   Eosinophils Relative 8.9 %   Basophils Relative 0.6 %  Hemoglobin A1c  Result Value Ref Range   Hgb A1c MFr Bld 6.2 (H) <5.7 % of total Hgb   Mean Plasma Glucose 131 (calc)   eAG (mmol/L) 7.3 (calc)      Assessment & Plan:   Problem List Items  Addressed This Visit    Essential hypertension    Controlled BP Home BP normal No known complications OFF Amlodipine 5-10mg  due to pedal edema    Plan:  1. Continue Lisinopril 20mg  daily 2. Encourage improved lifestyle - low sodium diet, regular exercise 3. Continue monitor BP outside office, bring readings to next visit, if persistently >140/90 or new symptoms notify office sooner      Pre-diabetes    Improved A1c to 6.2 improved lifestyle diet  Plan:  1. Not on any therapy currently  2. Encourage improved lifestyle - low carb, low sugar diet, reduce portion size, continue improving regular  exercise       Other Visit Diagnoses    Annual physical exam    -  Primary   Screening for colon cancer       Relevant Orders   Cologuard      Updated Health Maintenance information  Due for routine colon cancer screening. Last colonoscopy 8-9 yr ago - Discussion today about recommendations for either Colonoscopy or Cologuard screening, benefits and risks of screening, interested in Cologuard, understands that if positive then recommendation is for diagnostic colonoscopy to follow-up. - Ordered Cologuard today  Reviewed recent lab results with patient Encouraged improvement to lifestyle with diet and exercise - Goal of weight loss  Orders Placed This Encounter  Procedures  . Cologuard    No orders of the defined types were placed in this encounter.   Follow up plan: Return in about 6 months (around 02/24/2019) for 6 month follow-up PreDM A1c, HTN.  Saralyn PilarAlexander Clara Herbison, DO [facility] 08/24/2018, 4:28 PM

## 2018-08-24 NOTE — Assessment & Plan Note (Signed)
Improved A1c to 6.2 improved lifestyle diet  Plan:  1. Not on any therapy currently  2. Encourage improved lifestyle - low carb, low sugar diet, reduce portion size, continue improving regular exercise

## 2018-09-05 ENCOUNTER — Telehealth: Payer: Self-pay | Admitting: Family Medicine

## 2018-09-05 NOTE — Telephone Encounter (Signed)
Pt called said that he have his cologuard at home have decided that he did not want to use. Wanted to know if it was time to order a colonoscopy. Pt  Call back  # is  [phone]

## 2018-09-05 NOTE — Telephone Encounter (Signed)
Yes. Last result colonoscopy I had was from 2012 - technically colonoscopy is good for 5 to 10 years, on average we consider repeat by year 7.  It has been 8 years now approximately. He would likely be eligible for insurance coverage for repeat colonoscopy now, or can do it next year.  Up to patient. I can place referral to GI if he is ready to proceed.  He will need to let us know which GI he prefers - through [facility] locally in [place] or [place] or [place] or other.  Nobie Putnam, [facility] 09/05/2018, 12:55 PM

## 2018-09-05 NOTE — Telephone Encounter (Signed)
Informed patient about GI specialist and he wants to proceed with [place], but he wants to wait and find out about insurance coverage and call us back.

## 2018-12-10 ENCOUNTER — Other Ambulatory Visit: Payer: Self-pay | Admitting: Family Medicine

## 2018-12-10 DIAGNOSIS — A6000 Herpesviral infection of urogenital system, unspecified: Secondary | ICD-10-CM

## 2018-12-10 DIAGNOSIS — I10 Essential (primary) hypertension: Secondary | ICD-10-CM

## 2019-01-14 ENCOUNTER — Other Ambulatory Visit: Payer: Self-pay

## 2019-01-14 ENCOUNTER — Ambulatory Visit (INDEPENDENT_AMBULATORY_CARE_PROVIDER_SITE_OTHER): Payer: BC Managed Care – PPO | Admitting: Family Medicine

## 2019-01-14 ENCOUNTER — Encounter: Payer: Self-pay | Admitting: Family Medicine

## 2019-01-14 DIAGNOSIS — S29012A Strain of muscle and tendon of back wall of thorax, initial encounter: Secondary | ICD-10-CM | POA: Diagnosis not present

## 2019-01-14 MED ORDER — NAPROXEN 500 MG PO TABS: 500.0000 mg | ORAL_TABLET | Freq: Two times a day (BID) | ORAL | 0 refills | Status: DC

## 2019-01-14 MED ORDER — BACLOFEN 10 MG PO TABS: 5.0000 mg | ORAL_TABLET | Freq: Three times a day (TID) | ORAL | 1 refills | Status: DC | PRN

## 2019-01-14 NOTE — Progress Notes (Signed)
Virtual Visit via Telephone The purpose of this virtual visit is to provide medical care while limiting exposure to the novel coronavirus (COVID19) for both patient and office staff.  Consent was obtained for phone visit:  Yes.   Answered questions that patient had about telehealth interaction:  Yes.   I discussed the limitations, risks, security and privacy concerns of performing an evaluation and management service by telephone. I also discussed with the patient that there may be a patient responsible charge related to this service. The patient expressed understanding and agreed to proceed.  Patient Location: Home Provider Location: [facility])  ---------------------------------------------------------------------- Chief Complaint  Patient presents with  . Back Pain    S: Reviewed CMA documentation. I have called patient and gathered additional HPI as follows:  Upper Back Muscle Strain Reports that symptoms started onset 12/22 with heavy boxy lifting had muscle strain, had pain felt in the muscle not the back or elsewhere. He tried icy hot with some temporary relief. Also tried OTC Aleve 1-3 with some relief but not always taking. He has been managing with own strategy at home, and avoiding re-injury. Denies any fall or trauma or other injury Denies numbness tingling weakness  Denies any high risk travel to areas of current concern for COVID19. Denies any known or suspected exposure to person with or possibly with COVID19.  Denies any fevers, chills, sweats, body ache, cough, shortness of breath, sinus pain or pressure, headache, abdominal pain, diarrhea  History reviewed. No pertinent past medical history. Social History   Tobacco Use  . Smoking status: Former Smoker    Packs/day: 1.00    Years: 9.00    Pack years: 9.00    Types: Cigarettes  . Smokeless tobacco: Former Network engineer Use Topics  . Alcohol use: Not Currently    Comment: past  .  Drug use: Not Currently    Types: Marijuana, Cocaine    Comment: past     Current Outpatient Medications:  .  lisinopril (ZESTRIL) 20 MG tablet, TAKE 1 TABLET BY MOUTH EVERY DAY, Disp: 90 tablet, Rfl: 1 .  Multiple Vitamin (MULTI-VITAMINS) TABS, Take by mouth., Disp: , Rfl:  .  valACYclovir (VALTREX) 1000 MG tablet, TAKE 1 TABLET (1,000 MG TOTAL) BY MOUTH DAILY AS NEEDED. FOR FLARE UP FOR 5 DAYS THEN STOP, Disp: 20 tablet, Rfl: 1 .  baclofen (LIORESAL) 10 MG tablet, Take 0.5-1 tablets (5-10 mg total) by mouth 3 (three) times daily as needed for muscle spasms., Disp: 30 each, Rfl: 1 .  naproxen (NAPROSYN) 500 MG tablet, Take 1 tablet (500 mg total) by mouth 2 (two) times daily with a meal. For 1-2 weeks then as needed, Disp: 60 tablet, Rfl: 0  Depression screen [facility] 2/9 01/14/2019 08/24/2018 04/16/2018  Decreased Interest 0 0 0  Down, Depressed, Hopeless 0 0 0  PHQ - 2 Score 0 0 0    No flowsheet data found.  -------------------------------------------------------------------------- O: No physical exam performed due to remote telephone encounter.  Lab results reviewed.  No results found for this or any previous visit (from the past 2160 hour(s)).  -------------------------------------------------------------------------- A&P:  Problem List Items Addressed This Visit    None    Visit Diagnoses    Muscle strain of left upper back, initial encounter    -  Primary   Relevant Medications   baclofen (LIORESAL) 10 MG tablet   naproxen (NAPROSYN) 500 MG tablet     Acute upper back muscle strain secondary to  heavy lifting boxes No trauma or other injury No known chronic back injury - No red flag symptoms or neurological symptoms - Inadequate conservative therapy   Plan: 1. Start anti-inflammatory trial with rx Naprosyn 500mg  BID wc x 1-2 weeks, then PRN 2. Start muscle relaxant with Baclofen 10mg  tabs - take 5-10mg  up to TID PRN, titrate up as tolerated - caution sedation 3. May  use Tylenol PRN for breakthrough 4. Encouraged use of heating pad 1-2x daily for now then PRN 5.  Follow-up 4-6 weeks if not improved for re-evaluation, trial of PT   Meds ordered this encounter  Medications  . baclofen (LIORESAL) 10 MG tablet    Sig: Take 0.5-1 tablets (5-10 mg total) by mouth 3 (three) times daily as needed for muscle spasms.    Dispense:  30 each    Refill:  1  . naproxen (NAPROSYN) 500 MG tablet    Sig: Take 1 tablet (500 mg total) by mouth 2 (two) times daily with a meal. For 1-2 weeks then as needed    Dispense:  60 tablet    Refill:  0    Follow-up: - Return as needed  Patient verbalizes understanding with the above medical recommendations including the limitation of remote medical advice.  Specific follow-up and call-back criteria were given for patient to follow-up or seek medical care more urgently if needed.   - Time spent in direct consultation with patient on phone: 9 minutes    , DO [facility] 01/14/2019, 1:25 PM

## 2019-01-14 NOTE — Patient Instructions (Addendum)
Start taking Baclofen (Lioresal) 10mg  (muscle relaxant) - start with half (cut) to one whole pill at night as needed for next 1-3 nights (may make you drowsy, caution with driving) see how it affects you, then if tolerated increase to one pill 2 to 3 times a day or (every 8 hours as needed)  Recommend trial of Anti-inflammatory with Naproxen (Naprosyn) 500mg  tabs - take one with food and plenty of water TWICE daily every day (breakfast and dinner), for next 2 to 4 weeks, then you may take only as needed - DO NOT TAKE any ibuprofen, aleve, motrin while you are taking this medicine - It is safe to take Tylenol Ext Str 500mg  tabs - take 1 to 2 (max dose 1000mg ) every 6 hours as needed for breakthrough pain, max 24 hour daily dose is 6 to 8 tablets or 4000mg   Please schedule a Follow-up Appointment to: Return in about 4 weeks (around 02/11/2019), or if symptoms worsen or fail to improve, for back strain.  If you have any other questions or concerns, please feel free to call the office or send a message through [place]. You may also schedule an earlier appointment if necessary.  Additionally, you may be receiving a survey about your experience at our office within a few days to 1 week by e-mail or mail. We value your feedback.  Nobie Putnam, DO [place]

## 2019-01-20 ENCOUNTER — Other Ambulatory Visit: Payer: Self-pay | Admitting: Family Medicine

## 2019-01-20 DIAGNOSIS — S29012A Strain of muscle and tendon of back wall of thorax, initial encounter: Secondary | ICD-10-CM

## 2019-01-24 ENCOUNTER — Encounter: Payer: Self-pay | Admitting: Family Medicine

## 2019-01-24 ENCOUNTER — Ambulatory Visit (INDEPENDENT_AMBULATORY_CARE_PROVIDER_SITE_OTHER): Payer: BC Managed Care – PPO | Admitting: Family Medicine

## 2019-01-24 ENCOUNTER — Other Ambulatory Visit: Payer: Self-pay

## 2019-01-24 DIAGNOSIS — M25512 Pain in left shoulder: Secondary | ICD-10-CM

## 2019-01-24 DIAGNOSIS — S29012D Strain of muscle and tendon of back wall of thorax, subsequent encounter: Secondary | ICD-10-CM

## 2019-01-24 MED ORDER — PREDNISONE 10 MG PO TABS: ORAL_TABLET | ORAL | 0 refills | Status: DC

## 2019-01-24 NOTE — Progress Notes (Signed)
Virtual Visit via Telephone The purpose of this virtual visit is to provide medical care while limiting exposure to the novel coronavirus (COVID19) for both patient and office staff.  Consent was obtained for phone visit:  Yes.   Answered questions that patient had about telehealth interaction:  Yes.   I discussed the limitations, risks, security and privacy concerns of performing an evaluation and management service by telephone. I also discussed with the patient that there may be a patient responsible charge related to this service. The patient expressed understanding and agreed to proceed.  Patient Location: Home Provider Location: [facility])  ---------------------------------------------------------------------- Chief Complaint  Patient presents with  . Back Pain    onset 4 days    S: Reviewed CMA documentation. I have called patient and gathered additional HPI as follows:  Upper Back Muscle Strain, Left shoulder Pain - Last visit with me 01/14/19, for initial visit for same problem, treated with naproxen / baclofen therapy and relative rest and stretching, see prior notes for background information. - Interval update with limited relief, only temporary improve, has been about 2 weeks, he is still working - Today patient reports worse with lifting and work. He wanted to ask about other treatment options today. Now seems more in his LEFT shoulder with lifting worse, no new injury, associated upper back muscle strain still. Denies any fall or trauma or other injury Denies numbness tingling weakness  Denies any high risk travel to areas of current concern for COVID19. Denies any known or suspected exposure to person with or possibly with COVID19.  Denies any fevers, chills, sweats, body ache, cough, shortness of breath, sinus pain or pressure, headache, abdominal pain, diarrhea  History reviewed. No pertinent past medical history. Social History   Tobacco  Use  . Smoking status: Former Smoker    Packs/day: 1.00    Years: 9.00    Pack years: 9.00    Types: Cigarettes  . Smokeless tobacco: Former Network engineer Use Topics  . Alcohol use: Not Currently    Comment: past  . Drug use: Not Currently    Types: Marijuana, Cocaine    Comment: past     Current Outpatient Medications:  .  baclofen (LIORESAL) 10 MG tablet, Take 0.5-1 tablets (5-10 mg total) by mouth 3 (three) times daily as needed for muscle spasms., Disp: 30 each, Rfl: 1 .  lisinopril (ZESTRIL) 20 MG tablet, TAKE 1 TABLET BY MOUTH EVERY DAY, Disp: 90 tablet, Rfl: 1 .  Multiple Vitamin (MULTI-VITAMINS) TABS, Take by mouth., Disp: , Rfl:  .  naproxen (NAPROSYN) 500 MG tablet, TAKE 1 TABLET (500 MG TOTAL) BY MOUTH 2 (TWO) TIMES DAILY WITH A MEAL. FOR 1-2 WEEKS THEN AS NEEDED, Disp: 60 tablet, Rfl: 1 .  valACYclovir (VALTREX) 1000 MG tablet, TAKE 1 TABLET (1,000 MG TOTAL) BY MOUTH DAILY AS NEEDED. FOR FLARE UP FOR 5 DAYS THEN STOP, Disp: 20 tablet, Rfl: 1 .  predniSONE (DELTASONE) 10 MG tablet, Take 6 tabs with breakfast Day 1, 5 tabs Day 2, 4 tabs Day 3, 3 tabs Day 4, 2 tabs Day 5, 1 tab Day 6., Disp: 21 tablet, Rfl: 0  Depression screen [facility] 2/9 01/24/2019 01/14/2019 08/24/2018  Decreased Interest 0 0 0  Down, Depressed, Hopeless 0 0 0  PHQ - 2 Score 0 0 0    No flowsheet data found.  -------------------------------------------------------------------------- O: No physical exam performed due to remote telephone encounter.  Lab results reviewed.  No results found  for this or any previous visit (from the past 2160 hour(s)).  -------------------------------------------------------------------------- A&P:  Problem List Items Addressed This Visit    None    Visit Diagnoses    Muscle strain of left upper back, subsequent encounter    -  Primary   Relevant Medications   predniSONE (DELTASONE) 10 MG tablet   Acute pain of left shoulder       Relevant Medications   predniSONE  (DELTASONE) 10 MG tablet     Subacute upper back muscle strain secondary to heavy lifting boxes - persistent problem, now with some L shoulder associated bursitis vs impingement symptoms with repetitive lifting No trauma or other injury No known chronic back injury - No red flag symptoms or neurological symptoms - Inadequate conservative therapy   Plan: 1. HOLD Naproxen 2. START Prednisone 6 day taper 60 to 10mg  6 days - may restart naproxen after, precautions given for oral steroid course and affect on blood sugar etc. 3. Continue baclofen PRN 3. May use Tylenol PRN for breakthrough 4. Encouraged use of heating pad 1-2x daily for now then PRN  Follow-up 4-6 weeks if not improved for re-evaluation, trial of PT  Meds ordered this encounter  Medications  . predniSONE (DELTASONE) 10 MG tablet    Sig: Take 6 tabs with breakfast Day 1, 5 tabs Day 2, 4 tabs Day 3, 3 tabs Day 4, 2 tabs Day 5, 1 tab Day 6.    Dispense:  21 tablet    Refill:  0    Follow-up: - Return in 4-6 weeks if not improved  Patient verbalizes understanding with the above medical recommendations including the limitation of remote medical advice.  Specific follow-up and call-back criteria were given for patient to follow-up or seek medical care more urgently if needed.   - Time spent in direct consultation with patient on phone: 7 minutes   , DO [facility] 01/24/2019, 12:01 PM

## 2019-01-24 NOTE — Patient Instructions (Addendum)
Start Prednisone taper (steroid anti-inflammatory) for nerve irritation with pain in legs. Each pill is 10mg . Take 6 pills (60mg  daily) for 1 day at same time with breakfast, then each day reduce dose by 1 pill, so 5 pills, then 4, then 3, then 2 then 1 (last 6 days). Do not take any Ibuprofen or Aleve while taking the Prednisone.  - Once finished Prednisone, then start back with other anti-inflammatory Naproxen 500mg  one pill with food every 12 hours (or 2 times a day)   Continue BACLOFEN muscle relaxant  Avoid over use and re injury may take 4-6 weeks to heal  Please schedule a Follow-up Appointment to: Return in about 4 weeks (around 02/21/2019), or if symptoms worsen or fail to improve, for back shoulder pain PRN if not improved.  If you have any other questions or concerns, please feel free to call the office or send a message through MyChart. You may also schedule an earlier appointment if necessary.  Additionally, you may be receiving a survey about your experience at our office within a few days to 1 week by e-mail or mail. We value your feedback.  , DO [facility], [facility]  Range of Motion Shoulder Exercises  Pendulum Circles - Lean with your good arm against a counter or table for support [facility] forward with a wide stance (make sure your body is comfortable) - Your painful shoulder should hang down and feel "heavy" - Gently move your painful arm in small circles "clockwise" for several turns - Switch to "counterclockwise" for several turns - Early on keep circles narrow and move slowly - Later in rehab, move in larger circles and faster movement   Wall Crawl - Stand close (about 1-2 ft away) to a wall, facing it directly - Reach out with your arm of painful shoulder and place fingers (not palm) on wall - You should make contact with wall at your waist level - Slowly walk your fingers up the wall. Stay in contact with wall entire time, do not  remove fingers - Keep walking fingers up wall until you reach shoulder level - You may feel tightening or mild discomfort, once you reach a height that causes pain or if you are already above your shoulder height then stop. Repeat from starting position. - Early on stand closer to wall, move fingers slowly, and stay at or below shoulder level - Later in rehab, stand farther away from wall (fingertips), move fingers quicker, go above shoulder level

## 2019-01-25 ENCOUNTER — Telehealth: Payer: Self-pay | Admitting: Family Medicine

## 2019-01-25 NOTE — Telephone Encounter (Signed)
Work note written to [employer], DO [facility] 01/25/2019, 3:12 PM

## 2019-01-25 NOTE — Telephone Encounter (Signed)
Pt is requesting a letter to be out work [employer]ar[employer] and return to work Monday. I did tell him that the letter would be in [facility] CHART

## 2019-01-28 ENCOUNTER — Telehealth: Payer: Self-pay | Admitting: Family Medicine

## 2019-01-29 ENCOUNTER — Encounter: Payer: Self-pay | Admitting: Family Medicine

## 2019-01-29 ENCOUNTER — Telehealth: Payer: Self-pay | Admitting: Family Medicine

## 2019-01-29 NOTE — Telephone Encounter (Signed)
Please notify patient that: Letter written on chart. Sent to his MyChart.  I gave him a 2 week weight lifting restriction, no more than 40 lbs.  He can resume no restrictions at work on 02/13/19.  If he needs this time changed or note updated or any other issues, he can notify us.  Saralyn Pilar, DO [facility] 01/29/2019, 11:09 AM

## 2019-01-29 NOTE — Telephone Encounter (Signed)
Pt is requesting a letter stating that he can not left anything over 40 lbs

## 2019-01-29 NOTE — Telephone Encounter (Signed)
Informed patient asking for refill for predniSONE replied that it is too soon to refill since it will still be effective even if he finished the course. He understand well.

## 2019-02-04 ENCOUNTER — Ambulatory Visit (INDEPENDENT_AMBULATORY_CARE_PROVIDER_SITE_OTHER): Payer: BC Managed Care – PPO | Admitting: Family Medicine

## 2019-02-04 ENCOUNTER — Encounter: Payer: Self-pay | Admitting: Family Medicine

## 2019-02-04 ENCOUNTER — Other Ambulatory Visit: Payer: Self-pay

## 2019-02-04 VITALS — BP 112/58 | HR 82 | Temp 97.1°F | Ht 72.0 in | Wt 222.8 lb

## 2019-02-04 DIAGNOSIS — M25512 Pain in left shoulder: Secondary | ICD-10-CM

## 2019-02-04 DIAGNOSIS — S29012D Strain of muscle and tendon of back wall of thorax, subsequent encounter: Secondary | ICD-10-CM | POA: Diagnosis not present

## 2019-02-04 DIAGNOSIS — R202 Paresthesia of skin: Secondary | ICD-10-CM

## 2019-02-04 MED ORDER — GABAPENTIN 100 MG PO CAPS: ORAL_CAPSULE | ORAL | 1 refills | Status: DC

## 2019-02-04 NOTE — Patient Instructions (Addendum)
Thank you for coming to the office today.  Start Gabapentin 100mg  capsules, take at night for 2-3 nights only, and then increase to 2 times a day for a few days, and then may increase to 3 times a day, it may make you drowsy, if helps significantly at night only, then you can increase instead to 3 capsules at night, instead of 3 times a day - In the future if needed, we can significantly increase the dose if tolerated well, some common doses are 300mg  three times a day up to 600mg  three times a day, usually it takes several weeks or months to get to higher doses  If persistent problem, schedule with Orthopedic next.  [facility] [address] [place], [facility]  [address] Phone: [phone]  Please schedule a Follow-up Appointment to: Return in about 4 weeks (around 03/04/2019), or if symptoms worsen or fail to improve, for Left shoulder/back pain.  If you have any other questions or concerns, please feel free to call the office or send a message through MyChart. You may also schedule an earlier appointment if necessary.  Additionally, you may be receiving a survey about your experience at our office within a few days to 1 week by e-mail or mail. We value your feedback.  [id number], DO [facility], 03/06/2019

## 2019-02-04 NOTE — Progress Notes (Signed)
Subjective:    Patient ID: Kyle Buchanan, male    DOB: 18-Aug-1960, 59 y.o.   MRN: [id number]  Kyle Buchanan is a 59 y.o. male presenting on 02/04/2019 for Back Pain (left upper back and shoulder that radiates down the arm. Possibly it could be related to injury from lifting weights.x 6 weeks )   HPI   Upper Back Muscle Strain, Left shoulder Pain - Last visits with me 01/14/19 and 17/21, for same problem, treated with Baclofen / Naproxen PRN also was given a steroid burst also work restrictions, see prior notes for background information. - Interval update with steroid prednisone burst helped calm down symptoms and reduce pain / flare up, then symptoms had returned at times. He has been doing less at work is on lifting restrictions < 40 lbs up through 1/27, he still has some flare up of pain at times even if does light activity - Today patient reports he overall feels no significant improvement compared to about 4 weeks ago when this started - He is interested in trying other treatment and maybe talking to specialist at this point. Normally these type of problem do improve eventually for him Denies any fall or trauma or other injury Denies numbness tingling weakness    Depression screen [facility] 2/9 01/24/2019 01/14/2019 08/24/2018  Decreased Interest 0 0 0  Down, Depressed, Hopeless 0 0 0  PHQ - 2 Score 0 0 0    Social History   Tobacco Use  . Smoking status: Former Smoker    Packs/day: 1.00    Years: 9.00    Pack years: 9.00    Types: Cigarettes  . Smokeless tobacco: Former Network engineer Use Topics  . Alcohol use: Not Currently    Comment: past  . Drug use: Not Currently    Types: Marijuana, Cocaine    Comment: past     Review of Systems Per HPI unless specifically indicated above     Objective:    BP (!) 112/58 (BP Location: Right Arm, Patient Position: Sitting, Cuff Size: Large)   Pulse 82   Temp (!) 97.1 F (36.2 C) (Oral)   Ht 6' (1.829 m)   Wt 222 lb  12.8 oz (101.1 kg)   BMI 30.22 kg/m   Wt Readings from Last 3 Encounters:  02/04/19 222 lb 12.8 oz (101.1 kg)  08/24/18 216 lb 12.8 oz (98.3 kg)  02/19/18 214 lb (97.1 kg)    Physical Exam Vitals and nursing note reviewed.  Constitutional:      General: He is not in acute distress.    Appearance: He is well-developed. He is not diaphoretic.     Comments: Well-appearing, comfortable, cooperative  HENT:     Head: Normocephalic and atraumatic.  Eyes:     General:        Right eye: No discharge.        Left eye: No discharge.     Conjunctiva/sclera: Conjunctivae normal.  Neck:     Comments: Left sided mild reproduced localized pain L shoulder area with L spurling's, but not radicular pain. Cardiovascular:     Rate and Rhythm: Normal rate.  Pulmonary:     Effort: Pulmonary effort is normal.  Musculoskeletal:     Comments: Left Shoulder / Upper back Inspection: Normal appearance bilateral symmetrical Palpation: Non-tender to palpation over anterior, lateral, or posterior shoulder  ROM: Full intact active ROM forward flexion, abduction, internal / external rotation, symmetrical Special Testing: Rotator cuff testing negative for  weakness with supraspinatus full can and empty can test, negative impingement testing Strength: Normal strength 5/5 flex/ext, ext rot / int rot, grip, rotator cuff str testing. Neurovascular: Distally intact pulses, sensation to light touch   Skin:    General: Skin is warm and dry.     Findings: No erythema or rash.  Neurological:     Mental Status: He is alert and oriented to person, place, and time.  Psychiatric:        Behavior: Behavior normal.     Comments: Well groomed, good eye contact, normal speech and thoughts    Results for orders placed or performed in visit on 08/13/18  Lipid panel  Result Value Ref Range   Cholesterol 167 <200 mg/dL   HDL 59 > OR = 40 mg/dL   Triglycerides 220 <254 mg/dL   LDL Cholesterol (Calc) 88 mg/dL (calc)    Total CHOL/HDL Ratio 2.8 <5.0 (calc)   Non-HDL Cholesterol (Calc) 108 <130 mg/dL (calc)  COMPLETE METABOLIC PANEL WITH GFR  Result Value Ref Range   Glucose, Bld 115 (H) 65 - 99 mg/dL   BUN 20 7 - 25 mg/dL   Creat 2.70 6.23 - 7.62 mg/dL   GFR, Est Non African American 75 > OR = 60 mL/min/1.40m2   GFR, Est African American 87 > OR = 60 mL/min/1.21m2   BUN/Creatinine Ratio NOT APPLICABLE 6 - 22 (calc)   Sodium 137 135 - 146 mmol/L   Potassium 4.4 3.5 - 5.3 mmol/L   Chloride 102 98 - 110 mmol/L   CO2 24 20 - 32 mmol/L   Calcium 9.2 8.6 - 10.3 mg/dL   Total Protein 7.3 6.1 - 8.1 g/dL   Albumin 4.4 3.6 - 5.1 g/dL   Globulin 2.9 1.9 - 3.7 g/dL (calc)   AG Ratio 1.5 1.0 - 2.5 (calc)   Total Bilirubin 0.6 0.2 - 1.2 mg/dL   Alkaline phosphatase (APISO) 45 35 - 144 U/L   AST 31 10 - 35 U/L   ALT 34 9 - 46 U/L  PSA  Result Value Ref Range   PSA 0.5 < OR = 4.0 ng/mL  CBC with Differential/Platelet  Result Value Ref Range   WBC 3.3 (L) 3.8 - 10.8 Thousand/uL   RBC 5.22 4.20 - 5.80 Million/uL   Hemoglobin 14.2 13.2 - 17.1 g/dL   HCT 83.1 51.7 - 61.6 %   MCV 83.9 80.0 - 100.0 fL   MCH 27.2 27.0 - 33.0 pg   MCHC 32.4 32.0 - 36.0 g/dL   RDW 07.3 71.0 - 62.6 %   Platelets 202 140 - 400 Thousand/uL   MPV 9.0 7.5 - 12.5 fL   Neutro Abs 1,191 (L) 1,500 - 7,800 cells/uL   Lymphs Abs 1,442 850 - 3,900 cells/uL   Absolute Monocytes 353 200 - 950 cells/uL   Eosinophils Absolute 294 15 - 500 cells/uL   Basophils Absolute 20 0 - 200 cells/uL   Neutrophils Relative % 36.1 %   Total Lymphocyte 43.7 %   Monocytes Relative 10.7 %   Eosinophils Relative 8.9 %   Basophils Relative 0.6 %  Hemoglobin A1c  Result Value Ref Range   Hgb A1c MFr Bld 6.2 (H) <5.7 % of total Hgb   Mean Plasma Glucose 131 (calc)   eAG (mmol/L) 7.3 (calc)      Assessment & Plan:   Problem List Items Addressed This Visit    None    Visit Diagnoses    Paresthesia of left upper extremity    -  Primary   Relevant  Medications   gabapentin (NEURONTIN) 100 MG capsule   Other Relevant Orders   Ambulatory referral to Orthopedic Surgery   Muscle strain of left upper back, subsequent encounter       Relevant Medications   gabapentin (NEURONTIN) 100 MG capsule   Other Relevant Orders   Ambulatory referral to Orthopedic Surgery   Acute pain of left shoulder       Relevant Medications   gabapentin (NEURONTIN) 100 MG capsule   Other Relevant Orders   Ambulatory referral to Orthopedic Surgery      Subacute on chronic now L shoulder vs upper back muscle strain secondary to heavy lifting 4-6 weeks ago Persistent, seems less likely shoulder impingement at this time now, has full ROM and no obvious weakness, not likely rotator cuff tendinopathy, but may have had strain, seems only provoked in certain positions, not always predictable. No known chronic back injury - No red flag symptoms or neurological symptoms  Seems limited improvement on Naproxen, Baclofen, Prednisone - only temporary relief on steroid  Plan: 1. Add Gabapentin now 100mg  Titrate up to TID vs 300 QHS dosing for neuropathic symptoms / paresthesia now seems to be related may have pinched nerve causing symptoms can be cervical / shoulder. - Also add referral to [facility] as well - given duration 4 weeks with no significant improvement thus far, he would likely benefit from X-ray imaging and PT - will defer to Ortho if not improved, now also at [facility] they can consider Neuro if indicated based on symptoms if determines more neuropathic 2. Continue baclofen PRN / Naproxen PRN 3. May use Tylenol PRN for breakthrough 4. Encouraged use of heating pad 1-2x daily for now then PRN  Follow up as needed  Orders Placed This Encounter  Procedures  . Ambulatory referral to Orthopedic Surgery    Referral Priority:   Routine    Referral Type:   Surgical    Referral Reason:   Specialty Services Required    Requested Specialty:   Orthopedic  Surgery    Number of Visits Requested:   1     Meds ordered this encounter  Medications  . gabapentin (NEURONTIN) 100 MG capsule    Sig: Start 1 capsule daily, increase by 1 cap every 2-3 days as tolerated up to 3 times a day, or may take 3 at once in evening.    Dispense:  90 capsule    Refill:  1      Follow up plan: Return in about 4 weeks (around 03/04/2019), or if symptoms worsen or fail to improve, for Left shoulder/back pain.   03/06/2019, DO [facility] 02/04/2019, 10:55 AM

## 2019-02-08 NOTE — Telephone Encounter (Signed)
error 

## 2019-02-18 DIAGNOSIS — M79602 Pain in left arm: Secondary | ICD-10-CM | POA: Insufficient documentation

## 2019-03-15 ENCOUNTER — Ambulatory Visit (INDEPENDENT_AMBULATORY_CARE_PROVIDER_SITE_OTHER): Payer: BC Managed Care – PPO | Admitting: Family Medicine

## 2019-03-15 ENCOUNTER — Other Ambulatory Visit: Payer: Self-pay | Admitting: Family Medicine

## 2019-03-15 ENCOUNTER — Other Ambulatory Visit: Payer: Self-pay

## 2019-03-15 ENCOUNTER — Encounter: Payer: Self-pay | Admitting: Family Medicine

## 2019-03-15 VITALS — BP 90/68 | HR 84 | Temp 97.3°F | Resp 16 | Ht 72.0 in | Wt 232.0 lb

## 2019-03-15 DIAGNOSIS — E669 Obesity, unspecified: Secondary | ICD-10-CM

## 2019-03-15 DIAGNOSIS — E663 Overweight: Secondary | ICD-10-CM | POA: Insufficient documentation

## 2019-03-15 DIAGNOSIS — R7303 Prediabetes: Secondary | ICD-10-CM | POA: Diagnosis not present

## 2019-03-15 DIAGNOSIS — Z1211 Encounter for screening for malignant neoplasm of colon: Secondary | ICD-10-CM

## 2019-03-15 DIAGNOSIS — I10 Essential (primary) hypertension: Secondary | ICD-10-CM

## 2019-03-15 DIAGNOSIS — Z0000 Encounter for general adult medical examination without abnormal findings: Secondary | ICD-10-CM

## 2019-03-15 DIAGNOSIS — R351 Nocturia: Secondary | ICD-10-CM

## 2019-03-15 LAB — POCT GLYCOSYLATED HEMOGLOBIN (HGB A1C): Hemoglobin A1C: 6.4 % — AB (ref 4.0–5.6)

## 2019-03-15 NOTE — Assessment & Plan Note (Signed)
Elevated A1c back up to 6.4 Despite improved diet Weight gain now  Plan:  1. Not on any therapy currently  2. Encourage improved lifestyle - low carb, low sugar diet, reduce portion size, continue improving regular exercise  Defer medication, keep emphasis on lifestyle and return 6 months

## 2019-03-15 NOTE — Assessment & Plan Note (Addendum)
Controlled BP, low normal ?Home BP normal ?No known complications ?OFF Amlodipine 5-10mg due to pedal edema ?  ? ?Plan:  ?1. Continue Lisinopril 20mg daily ?2. Encourage improved lifestyle - low sodium diet, regular exercise ?3. Continue monitor BP outside office, bring readings to next visit, if persistently >140/90 or new symptoms notify office sooner ?

## 2019-03-15 NOTE — Progress Notes (Signed)
Subjective:    Patient ID: Kyle Buchanan, male    DOB: [birth year], 60 y.o.   MRN: [id number]  Kyle Buchanan is a 59 y.o. male presenting on 03/15/2019 for Hypertension   HPI   Pre-Diabetes / Obesity BMI >31 Previous Trend A1c down to 6.2. Due today. Improving lifestyle diet CBGs:Not checking CBG Meds:Never on med Currentlynot onACEi / ARB Lifestyle: - Weight gain 9 lbs in 1 month - Diet (Improved diet, healthy options - reduced sweets) - Exercise (He does walking and goal to improve back on exercise bike) Denies hypoglycemia, polyuria, visual changes, numbness or tingling.  CHRONIC HTN See prior notes for background information. Remains off Amlodipine. Today he is doing well, BP is controlled on Lisinopril Taking Lisinopril 20mg  daily Reports good compliance, took meds today. Tolerating well, w/o complaints. Denies CP, dyspnea, HA, edema, dizziness / lightheadedness  Health Maintenance:  Colon CA Screening: [facility] results withnegative result without polyp, good forup to 5-10years. Currently asymptomatic. No known family history of colon CA.    Depression screen [facility] 2/9 03/15/2019 01/24/2019 01/14/2019  Decreased Interest 0 0 0  Down, Depressed, Hopeless 0 0 0  PHQ - 2 Score 0 0 0    Social History   Tobacco Use  . Smoking status: Former Smoker    Packs/day: 1.00    Years: 9.00    Pack years: 9.00    Types: Cigarettes  . Smokeless tobacco: Former 01/16/2019 Use Topics  . Alcohol use: Not Currently    Comment: past  . Drug use: Not Currently    Types: Marijuana, Cocaine    Comment: past     Review of Systems Per HPI unless specifically indicated above     Objective:    BP 90/68   Pulse 84   Temp (!) 97.3 F (36.3 C) (Other (Comment))   Resp 16   Ht 6' (1.829 m)   Wt 232 lb (105.2 kg)   BMI 31.46 kg/m   Wt Readings from Last 3 Encounters:  03/15/19 232 lb (105.2 kg)  02/04/19 222 lb  12.8 oz (101.1 kg)  08/24/18 216 lb 12.8 oz (98.3 kg)    Physical Exam Vitals and nursing note reviewed.  Constitutional:      General: He is not in acute distress.    Appearance: He is well-developed. He is not diaphoretic.     Comments: Well-appearing, comfortable, cooperative  HENT:     Head: Normocephalic and atraumatic.  Eyes:     General:        Right eye: No discharge.        Left eye: No discharge.     Conjunctiva/sclera: Conjunctivae normal.  Cardiovascular:     Rate and Rhythm: Normal rate.  Pulmonary:     Effort: Pulmonary effort is normal.  Skin:    General: Skin is warm and dry.     Findings: No erythema or rash.  Neurological:     Mental Status: He is alert and oriented to person, place, and time.  Psychiatric:        Behavior: Behavior normal.     Comments: Well groomed, good eye contact, normal speech and thoughts    Results for orders placed or performed in visit on 03/15/19  POCT HgB A1C  Result Value Ref Range   Hemoglobin A1C 6.4 (A) 4.0 - 5.6 %        Assessment & Plan:   Problem List Items Addressed This  Visit    Pre-diabetes - Primary    Elevated A1c back up to 6.4 Despite improved diet Weight gain now  Plan:  1. Not on any therapy currently  2. Encourage improved lifestyle - low carb, low sugar diet, reduce portion size, continue improving regular exercise  Defer medication, keep emphasis on lifestyle and return 6 months      Relevant Orders   POCT HgB A1C (Completed)   Obesity (BMI 30.0-34.9)   Essential hypertension    Controlled BP, low normal Home BP normal No known complications OFF Amlodipine 5-10mg  due to pedal edema    Plan:  1. Continue Lisinopril 20mg  daily 2. Encourage improved lifestyle - low sodium diet, regular exercise 3. Continue monitor BP outside office, bring readings to next visit, if persistently >140/90 or new symptoms notify office sooner       Other Visit Diagnoses    Screening for colon cancer         Relevant Orders   Ambulatory referral to Gastroenterology      Orders Placed This Encounter  Procedures  . Ambulatory referral to Gastroenterology    Referral Priority:   Routine    Referral Type:   Consultation    Referral Reason:   Specialty Services Required    Number of Visits Requested:   1  . POCT HgB A1C     No orders of the defined types were placed in this encounter.    Follow up plan: Return in about 6 months (around 09/12/2019) for Annual Physical.  Future labs ordered for 08/2019  Kyle Buchanan, [facility] 03/15/2019, 8:14 AM

## 2019-03-15 NOTE — Patient Instructions (Addendum)
Thank you for coming to the office today.  Recent Labs    04/11/18 0807 08/13/18 0820 03/15/19 0815  HGBA1C 6.3* 6.2* 6.4*   Colonscopy referral to [facility] in [place] stay tuned for apt and scheduling.  DUE for FASTING BLOOD WORK (no food or drink after midnight before the lab appointment, only water or coffee without cream/sugar on the morning of)  SCHEDULE "Lab Only" visit in the morning at the clinic for lab draw in 6 MONTHS   - Make sure Lab Only appointment is at about 1 week before your next appointment, so that results will be available  For Lab Results, once available within 2-3 days of blood draw, you can can log in to MyChart online to view your results and a brief explanation. Also, we can discuss results at next follow-up visit.   Please schedule a Follow-up Appointment to: Return in about 6 months (around 09/12/2019) for Annual Physical.  If you have any other questions or concerns, please feel free to call the office or send a message through MyChart. You may also schedule an earlier appointment if necessary.  Additionally, you may be receiving a survey about your experience at our office within a few days to 1 week by e-mail or mail. We value your feedback.  Saralyn Pilar, DO [facility], [place]

## 2019-03-21 ENCOUNTER — Other Ambulatory Visit: Payer: Self-pay

## 2019-03-21 ENCOUNTER — Telehealth: Payer: Self-pay

## 2019-03-21 DIAGNOSIS — Z1211 Encounter for screening for malignant neoplasm of colon: Secondary | ICD-10-CM

## 2019-03-21 NOTE — Telephone Encounter (Signed)
Gastroenterology Pre-Procedure Review  Request Date: Wed 04/10/19 Requesting Physician: Dr. Allegra Lai  PATIENT REVIEW QUESTIONS: The patient responded to the following health history questions as indicated:    1. Are you having any GI issues? no 2. Do you have a personal history of Polyps? no 3. Do you have a family history of Colon Cancer or Polyps? no 4. Diabetes Mellitus? no 5. Joint replacements in the past 12 months?no 6. Major health problems in the past 3 months?no 7. Any artificial heart valves, MVP, or defibrillator?no    MEDICATIONS & ALLERGIES:    Patient reports the following regarding taking any anticoagulation/antiplatelet therapy:   Plavix, Coumadin, Eliquis, Xarelto, Lovenox, Pradaxa, Brilinta, or Effient? no Aspirin? no  Patient confirms/reports the following medications:  Current Outpatient Medications  Medication Sig Dispense Refill  . baclofen (LIORESAL) 10 MG tablet Take 0.5-1 tablets (5-10 mg total) by mouth 3 (three) times daily as needed for muscle spasms. 30 each 1  . diclofenac (VOLTAREN) 75 MG EC tablet PLEASE SEE ATTACHED FOR DETAILED DIRECTIONS    . gabapentin (NEURONTIN) 100 MG capsule Start 1 capsule daily, increase by 1 cap every 2-3 days as tolerated up to 3 times a day, or may take 3 at once in evening. 90 capsule 1  . lisinopril (ZESTRIL) 20 MG tablet TAKE 1 TABLET BY MOUTH EVERY DAY 90 tablet 1  . Multiple Vitamin (MULTI-VITAMINS) TABS Take by mouth.    . naproxen (NAPROSYN) 500 MG tablet TAKE 1 TABLET (500 MG TOTAL) BY MOUTH 2 (TWO) TIMES DAILY WITH A MEAL. FOR 1-2 WEEKS THEN AS NEEDED 60 tablet 1  . valACYclovir (VALTREX) 1000 MG tablet TAKE 1 TABLET (1,000 MG TOTAL) BY MOUTH DAILY AS NEEDED. FOR FLARE UP FOR 5 DAYS THEN STOP 20 tablet 1   No current facility-administered medications for this visit.    Patient confirms/reports the following allergies:  Allergies  Allergen Reactions  . Amlodipine Swelling    Pedal edema  . Flonase [Fluticasone  Propionate] Other (See Comments)    drowsy  . Loratadine     Other reaction(s): Other (See Comments), Other (See Comments) drowsiness drowsiness   . Cetirizine Nausea Only    No orders of the defined types were placed in this encounter.   AUTHORIZATION INFORMATION Primary Insurance: 1D#: Group #:  Secondary Insurance: 1D#: Group #:  SCHEDULE INFORMATION: Date: 04/10/19 Time: Location:[facility]

## 2019-03-26 ENCOUNTER — Other Ambulatory Visit: Payer: Self-pay | Admitting: Family Medicine

## 2019-03-26 DIAGNOSIS — S29012D Strain of muscle and tendon of back wall of thorax, subsequent encounter: Secondary | ICD-10-CM

## 2019-03-26 DIAGNOSIS — M25512 Pain in left shoulder: Secondary | ICD-10-CM

## 2019-03-26 DIAGNOSIS — R202 Paresthesia of skin: Secondary | ICD-10-CM

## 2019-04-03 DIAGNOSIS — R29898 Other symptoms and signs involving the musculoskeletal system: Secondary | ICD-10-CM | POA: Insufficient documentation

## 2019-04-03 DIAGNOSIS — R200 Anesthesia of skin: Secondary | ICD-10-CM | POA: Insufficient documentation

## 2019-04-08 ENCOUNTER — Other Ambulatory Visit
Admission: RE | Admit: 2019-04-08 | Discharge: 2019-04-08 | Disposition: A | Discharge status: Home / Self Care | Payer: BC Managed Care – PPO | Source: Ambulatory Visit | Attending: Gastroenterology | Admitting: Gastroenterology

## 2019-04-08 ENCOUNTER — Other Ambulatory Visit: Payer: Self-pay

## 2019-04-08 DIAGNOSIS — Z01812 Encounter for preprocedural laboratory examination: Secondary | ICD-10-CM | POA: Insufficient documentation

## 2019-04-08 DIAGNOSIS — Z20822 Contact with and (suspected) exposure to covid-19: Secondary | ICD-10-CM | POA: Diagnosis not present

## 2019-04-08 LAB — SARS CORONAVIRUS 2 (TAT 6-24 HRS): SARS Coronavirus 2: NEGATIVE

## 2019-04-10 ENCOUNTER — Ambulatory Visit: Payer: BC Managed Care – PPO | Admitting: Certified Registered"

## 2019-04-10 ENCOUNTER — Encounter
Admission: RE | Disposition: A | Discharge status: Home / Self Care | Payer: Self-pay | Source: Home / Self Care | Attending: Gastroenterology

## 2019-04-10 ENCOUNTER — Ambulatory Visit
Admission: RE | Admit: 2019-04-10 | Discharge: 2019-04-10 | Disposition: A | Discharge status: Home / Self Care | Payer: BC Managed Care – PPO | Attending: Gastroenterology | Admitting: Gastroenterology

## 2019-04-10 ENCOUNTER — Other Ambulatory Visit: Payer: Self-pay

## 2019-04-10 ENCOUNTER — Encounter: Payer: Self-pay | Admitting: Gastroenterology

## 2019-04-10 DIAGNOSIS — Z87891 Personal history of nicotine dependence: Secondary | ICD-10-CM | POA: Insufficient documentation

## 2019-04-10 DIAGNOSIS — Z1211 Encounter for screening for malignant neoplasm of colon: Secondary | ICD-10-CM | POA: Insufficient documentation

## 2019-04-10 DIAGNOSIS — Z79899 Other long term (current) drug therapy: Secondary | ICD-10-CM | POA: Diagnosis not present

## 2019-04-10 DIAGNOSIS — Z791 Long term (current) use of non-steroidal anti-inflammatories (NSAID): Secondary | ICD-10-CM | POA: Diagnosis not present

## 2019-04-10 DIAGNOSIS — Z888 Allergy status to other drugs, medicaments and biological substances status: Secondary | ICD-10-CM | POA: Insufficient documentation

## 2019-04-10 DIAGNOSIS — Z8619 Personal history of other infectious and parasitic diseases: Secondary | ICD-10-CM | POA: Diagnosis not present

## 2019-04-10 HISTORY — PX: COLONOSCOPY WITH PROPOFOL: SHX5780

## 2019-04-10 HISTORY — DX: Unspecified viral hepatitis C without hepatic coma: B19.20

## 2019-04-10 LAB — URINE DRUG SCREEN, QUALITATIVE (ARMC ONLY)
Amphetamines, Ur Screen: NOT DETECTED
Barbiturates, Ur Screen: NOT DETECTED
Benzodiazepine, Ur Scrn: NOT DETECTED
Cannabinoid 50 Ng, Ur ~~LOC~~: NOT DETECTED
Cocaine Metabolite,Ur ~~LOC~~: NOT DETECTED
MDMA (Ecstasy)Ur Screen: NOT DETECTED
Methadone Scn, Ur: NOT DETECTED
Opiate, Ur Screen: NOT DETECTED
Phencyclidine (PCP) Ur S: NOT DETECTED
Tricyclic, Ur Screen: NOT DETECTED

## 2019-04-10 SURGERY — COLONOSCOPY WITH PROPOFOL
Anesthesia: General

## 2019-04-10 MED ORDER — MIDAZOLAM HCL 2 MG/2ML IJ SOLN
INTRAMUSCULAR | Status: AC
  Filled 2019-04-10: qty 2

## 2019-04-10 MED ORDER — PROPOFOL 10 MG/ML IV BOLUS
INTRAVENOUS | Status: DC | PRN
  Administered 2019-04-10 (×3): 50 mg via INTRAVENOUS

## 2019-04-10 MED ORDER — PROPOFOL 500 MG/50ML IV EMUL
INTRAVENOUS | Status: DC | PRN
  Administered 2019-04-10: 120 ug/kg/min via INTRAVENOUS

## 2019-04-10 MED ORDER — MIDAZOLAM HCL 5 MG/5ML IJ SOLN
INTRAMUSCULAR | Status: DC | PRN
  Administered 2019-04-10: 2 mg via INTRAVENOUS

## 2019-04-10 MED ORDER — LIDOCAINE 2% (20 MG/ML) 5 ML SYRINGE
INTRAMUSCULAR | Status: DC | PRN
  Administered 2019-04-10: 25 mg via INTRAVENOUS

## 2019-04-10 MED ORDER — SODIUM CHLORIDE 0.9 % IV SOLN: INTRAVENOUS | Status: DC

## 2019-04-10 NOTE — Op Note (Signed)
[facility]astroenterology Patient Name: Kyle Buchanan Procedure Date: 04/10/2019 11:16 AM MRN: [id number] Account #: [id number] Date of Birth: 08-31-1960 Admit Type: Outpatient Age: 59 Room: [facility] ENDO ROOM 4 Gender: Male Note Status: Finalized Procedure:             Colonoscopy Indications:           Screening for colorectal malignant neoplasm Providers:             Lin Landsman MD, MD Referring MD:          Olin Hauser (Referring MD) Medicines:             Monitored Anesthesia Care Complications:         No immediate complications. Estimated blood loss: None. Procedure:             Pre-Anesthesia Assessment:                        - Prior to the procedure, a History and Physical was                         performed, and patient medications and allergies were                         reviewed. The patient is competent. The risks and                         benefits of the procedure and the sedation options and                         risks were discussed with the patient. All questions                         were answered and informed consent was obtained.                         Patient identification and proposed procedure were                         verified by the physician, the nurse, the                         anesthesiologist, the anesthetist and the technician                         in the pre-procedure area in the procedure room in the                         endoscopy suite. Mental Status Examination: alert and                         oriented. Airway Examination: normal oropharyngeal                         airway and neck mobility. Respiratory Examination:                         clear to auscultation. CV Examination: normal.  Prophylactic Antibiotics: The patient does not require                         prophylactic antibiotics. Prior Anticoagulants: The                         patient has taken no  previous anticoagulant or                         antiplatelet agents. ASA Grade Assessment: II - A                         patient with mild systemic disease. After reviewing                         the risks and benefits, the patient was deemed in                         satisfactory condition to undergo the procedure. The                         anesthesia plan was to use monitored anesthesia care                         (MAC). Immediately prior to administration of                         medications, the patient was re-assessed for adequacy                         to receive sedatives. The heart rate, respiratory                         rate, oxygen saturations, blood pressure, adequacy of                         pulmonary ventilation, and response to care were                         monitored throughout the procedure. The physical                         status of the patient was re-assessed after the                         procedure.                        After obtaining informed consent, the colonoscope was                         passed under direct vision. Throughout the procedure,                         the patient's blood pressure, pulse, and oxygen                         saturations were monitored continuously. The  Colonoscope was introduced through the anus and                         advanced to the the cecum, identified by appendiceal                         orifice and ileocecal valve. The colonoscopy was                         performed without difficulty. The patient tolerated                         the procedure well. The quality of the bowel                         preparation was evaluated using the BBPS [facility] Bowel                         Preparation Scale) with scores of: Right Colon = 2                         (minor amount of residual staining, small fragments of                         stool and/or opaque liquid, but mucosa seen  well),                         Transverse Colon = 2 (minor amount of residual                         staining, small fragments of stool and/or opaque                         liquid, but mucosa seen well) and Left Colon = 2                         (minor amount of residual staining, small fragments of                         stool and/or opaque liquid, but mucosa seen well). The                         total BBPS score equals 6. Findings:      The perianal and digital rectal examinations were normal. Pertinent       negatives include normal sphincter tone and no palpable rectal lesions.      The entire examined colon appeared normal. Impression:            - The entire examined colon is normal.                        - No specimens collected. Recommendation:        - Discharge patient to home (with escort).                        - Resume previous diet today.                        -  Continue present medications.                        - Repeat colonoscopy in 7 years for surveillance. Procedure Code(s):     --- Professional ---                        V6979, Colorectal cancer screening; colonoscopy on                         individual not meeting criteria for high risk Diagnosis Code(s):     --- Professional ---                        Z12.11, Encounter for screening for malignant neoplasm                         of colon CPT copyright 2019 American Medical Association. All rights reserved. The codes documented in this report are preliminary and upon coder review may  be revised to meet current compliance requirements. Dr. Ulyess Mort Lin Landsman MD, MD 04/10/2019 11:37:02 AM This report has been signed electronically. Number of Addenda: 0 Note Initiated On: 04/10/2019 11:16 AM Scope Withdrawal Time: 0 hours 9 minutes 24 seconds  Total Procedure Duration: 0 hours 11 minutes 46 seconds  Estimated Blood Loss:  Estimated blood loss: none.      [facility]

## 2019-04-10 NOTE — Anesthesia Preprocedure Evaluation (Addendum)
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 No pertinent surgical history.  BMI    Body Mass Index: 29.97 kg/m      Reproductive/Obstetrics negative OB ROS                           Anesthesia Physical Anesthesia Plan  ASA: II  Anesthesia Plan: General   Post-op Pain Management:    Induction:   PONV Risk Score and Plan: Propofol infusion and TIVA  Airway Management Planned: Natural Airway and Nasal Cannula  Additional Equipment:   Intra-op Plan:   Post-operative Plan:   Informed Consent: I have reviewed the patients History and Physical, chart, labs and discussed the procedure including the risks, benefits and alternatives for the proposed anesthesia with the patient or authorized representative who has indicated his/her understanding and acceptance.     Dental Advisory Given  Plan Discussed with: Anesthesiologist  Anesthesia Plan Comments:         Anesthesia Quick Evaluation

## 2019-04-10 NOTE — H&P (Signed)
Arlyss Repress, MD [address]  [address]  [place], [place] [address]  Main: [phone]  Fax: [phone] Pager: [phone]  Primary Care Physician:  Smitty Cords, DO Primary Gastroenterologist:  Dr. Arlyss Repress  Pre-Procedure History & Physical: HPI:  Kyle Buchanan is a 59 y.o. male is here for an colonoscopy.   Past Medical History:  Diagnosis Date  . Hepatitis C     History reviewed. No pertinent surgical history.  Prior to Admission medications   Medication Sig Start Date End Date Taking? Authorizing Provider  baclofen (LIORESAL) 10 MG tablet Take 0.5-1 tablets (5-10 mg total) by mouth 3 (three) times daily as needed for muscle spasms. 01/14/19   Karamalegos, Netta Neat, DO  diclofenac (VOLTAREN) 75 MG EC tablet PLEASE SEE ATTACHED FOR DETAILED DIRECTIONS 02/18/19   [provider]  gabapentin (NEURONTIN) 100 MG capsule START 1 CAPSULE DAILY, INCREASE BY 1 CAP EVERY 2-3 DAYS AS TOLERATED UP TO 3 TIMES A DAY, OR MAY TAKE 3 AT ONCE IN EVENING. 03/26/19   Karamalegos, Netta Neat, DO  lisinopril (ZESTRIL) 20 MG tablet TAKE 1 TABLET BY MOUTH EVERY DAY 12/10/18   Althea Charon, Netta Neat, DO  Multiple Vitamin (MULTI-VITAMINS) TABS Take by mouth.    [provider]  naproxen (NAPROSYN) 500 MG tablet TAKE 1 TABLET (500 MG TOTAL) BY MOUTH 2 (TWO) TIMES DAILY WITH A MEAL. FOR 1-2 WEEKS THEN AS NEEDED 01/21/19   Althea Charon, Netta Neat, DO  valACYclovir (VALTREX) 1000 MG tablet TAKE 1 TABLET (1,000 MG TOTAL) BY MOUTH DAILY AS NEEDED. FOR FLARE UP FOR 5 DAYS THEN STOP 12/10/18   Smitty Cords, DO    Allergies as of 03/21/2019 - Review Complete 03/15/2019  Allergen Reaction Noted  . Amlodipine Swelling 01/08/2018  . Flonase [fluticasone propionate] Other (See Comments) 02/19/2018  . Loratadine  10/16/2012  . Cetirizine Nausea Only 03/23/2017    Family History  Problem Relation Age of Onset  . Cancer Father        bone  cancer  . Cancer Sister        uncertain type    Social History   Socioeconomic History  . Marital status: Married    Spouse name: Not on file  . Number of children: Not on file  . Years of education: Not on file  . Highest education level: Not on file  Occupational History  . Not on file  Tobacco Use  . Smoking status: Former Smoker    Packs/day: 1.00    Years: 9.00    Pack years: 9.00    Types: Cigarettes  . Smokeless tobacco: Former Engineer, water and Sexual Activity  . Alcohol use: Not Currently    Comment: past  . Drug use: Not Currently    Types: Marijuana, Cocaine    Comment: past   . Sexual activity: Not on file  Other Topics Concern  . Not on file  Social History Narrative  . Not on file   Social Determinants of Health   Financial Resource Strain:   . Difficulty of Paying Living Expenses:   Food Insecurity:   . Worried About Programme researcher, broadcasting/film/video in the Last Year:   . Barista in the Last Year:   Transportation Needs:   . Freight forwarder (Medical):   Marland Kitchen Lack of Transportation (Non-Medical):   Physical Activity:   . Days of Exercise per Week:   . Minutes of Exercise per Session:  Stress:   . Feeling of Stress :   Social Connections:   . Frequency of Communication with Friends and Family:   . Frequency of Social Gatherings with Friends and Family:   . Attends Religious Services:   . Active Member of Clubs or Organizations:   . Attends Archivist Meetings:   Marland Kitchen Marital Status:   Intimate Partner Violence:   . Fear of Current or Ex-Partner:   . Emotionally Abused:   Marland Kitchen Physically Abused:   . Sexually Abused:     Review of Systems: See HPI, otherwise negative ROS  Physical Exam: BP (!) 147/91   Pulse 83   Temp (!) 96.8 F (36 C) (Temporal)   Resp 20   Ht 6' (1.829 m)   Wt 100.2 kg   SpO2 100%   BMI 29.97 kg/m  General:   Alert,  pleasant and cooperative in NAD Head:  Normocephalic and atraumatic. Neck:  Supple; no  masses or thyromegaly. Lungs:  Clear throughout to auscultation.    Heart:  Regular rate and rhythm. Abdomen:  Soft, nontender and nondistended. Normal bowel sounds, without guarding, and without rebound.   Neurologic:  Alert and  oriented x4;  grossly normal neurologically.  Impression/Plan: Kyle Buchanan is here for an colonoscopy to be performed for colon cancer screening  Risks, benefits, limitations, and alternatives regarding  colonoscopy have been reviewed with the patient.  Questions have been answered.  All parties agreeable.   Sherri Sear, MD  04/10/2019, 11:06 AM

## 2019-04-10 NOTE — Transfer of Care (Signed)
Immediate Anesthesia Transfer of Care Note  Patient: Kyle Buchanan  Procedure(s) Performed: COLONOSCOPY WITH PROPOFOL (N/A )  Patient Location: Endoscopy Unit  Anesthesia Type:General  Level of Consciousness: sedated  Airway & Oxygen Therapy: Patient Spontanous Breathing and Patient connected to nasal cannula oxygen  Post-op Assessment: Report given to RN and Post -op Vital signs reviewed and stable  Post vital signs: Reviewed  Last Vitals:  Vitals Value Taken Time  BP 122/77 04/10/19 1139  Temp 36.2 C 04/10/19 1139  Pulse 79 04/10/19 1139  Resp 15 04/10/19 1139  SpO2 100 % 04/10/19 1139  Vitals shown include unvalidated device data.  Last Pain:  Vitals:   04/10/19 1138  TempSrc: Temporal  PainSc: Asleep         Complications: No apparent anesthesia complications

## 2019-04-11 ENCOUNTER — Encounter: Payer: Self-pay | Admitting: *Deleted

## 2019-04-11 NOTE — Anesthesia Postprocedure Evaluation (Signed)
Anesthesia Post Note  Patient: Kyle Buchanan  Procedure(s) Performed: COLONOSCOPY WITH PROPOFOL (N/A )  Patient location during evaluation: PACU Anesthesia Type: General Level of consciousness: awake and alert Pain management: pain level controlled Vital Signs Assessment: post-procedure vital signs reviewed and stable Respiratory status: spontaneous breathing, nonlabored ventilation and respiratory function stable Cardiovascular status: blood pressure returned to baseline and stable Postop Assessment: no apparent nausea or vomiting Anesthetic complications: no     Last Vitals:  Vitals:   04/10/19 1148 04/10/19 1218  BP: 119/84 130/86  Pulse:    Resp:    Temp:    SpO2:      Last Pain:  Vitals:   04/11/19 0729  TempSrc:   PainSc: 0-No pain                 Karleen Hampshire

## 2019-05-24 ENCOUNTER — Telehealth (INDEPENDENT_AMBULATORY_CARE_PROVIDER_SITE_OTHER): Payer: BC Managed Care – PPO | Admitting: Family Medicine

## 2019-05-24 ENCOUNTER — Other Ambulatory Visit: Payer: Self-pay

## 2019-05-24 ENCOUNTER — Encounter: Payer: Self-pay | Admitting: Family Medicine

## 2019-05-24 DIAGNOSIS — N522 Drug-induced erectile dysfunction: Secondary | ICD-10-CM

## 2019-05-24 MED ORDER — SILDENAFIL CITRATE 20 MG PO TABS: ORAL_TABLET | ORAL | 3 refills | Status: DC

## 2019-05-24 NOTE — Patient Instructions (Addendum)
Start Sildenafil 20mg  - take 1 tablet about 30 min prior to sexual intercourse for improved erection. If this dose does not work or is not strong enough NEXT TIME you can increase to 2 pills for 40mg . Maximum dose is 5 pills or 100mg , most people end up taking 3-4 pills per dose. Up to you.  Once you take a dose, you have to wait 24 hours to repeat a dose.  You will need to use www.goodrx.com website or app on phone to enter "Sildenafil" 20mg  medication and "Get Free Coupon" option to save for [facility] once you select pharmacy and # of pills. Show that coupon to pharmacy. Otherwise it will cost hundreds of dollars.  Follow up if not working or new concerns we can refer you to a Urologist.   Please schedule a Follow-up Appointment to: Return if symptoms worsen or fail to improve, for erectile dysfunction.  If you have any other questions or concerns, please feel free to call the office or send a message through MyChart. You may also schedule an earlier appointment if necessary.  Additionally, you may be receiving a survey about your experience at our office within a few days to 1 week by e-mail or mail. We value your feedback.  , DO [facility], 

## 2019-05-24 NOTE — Progress Notes (Signed)
Virtual Visit via Telephone The purpose of this virtual visit is to provide medical care while limiting exposure to the novel coronavirus (COVID19) for both patient and office staff.  Consent was obtained for phone visit:  Yes.   Answered questions that patient had about telehealth interaction:  Yes.   I discussed the limitations, risks, security and privacy concerns of performing an evaluation and management service by telephone. I also discussed with the patient that there may be a patient responsible charge related to this service. The patient expressed understanding and agreed to proceed.  Patient Location: Home Provider Location: [facility])  ---------------------------------------------------------------------- Chief Complaint  Patient presents with  . Erectile Dysfunction    pt state since taking medication to treat his arms he notice it was causing ED.     S: Reviewed CMA documentation. I have called patient and gathered additional HPI as follows:  Erectile Dysfunction Reports that symptoms started several weeks or months ago, unsure when exactly, he is having erectile dysfunction difficulty obtaining full erection. He has good sexual drive and interest. No other problems related to erections or sexual function Never tried viagra or similar med before Says likely started since he has been on BP medications  Denies any known or suspected exposure to person with or possibly with COVID19.  Denies any dysuria, hematuria, fevers, chills, sweats, body ache, cough, shortness of breath, sinus pain or pressure, headache, abdominal pain, diarrhea  Past Medical History:  Diagnosis Date  . Hepatitis C    Social History   Tobacco Use  . Smoking status: Former Smoker    Packs/day: 1.00    Years: 9.00    Pack years: 9.00    Types: Cigarettes  . Smokeless tobacco: Former Network engineer Use Topics  . Alcohol use: Not Currently    Comment: past  . Drug use:  Not Currently    Types: Marijuana, Cocaine    Comment: past     Current Outpatient Medications:  .  gabapentin (NEURONTIN) 100 MG capsule, START 1 CAPSULE DAILY, INCREASE BY 1 CAP EVERY 2-3 DAYS AS TOLERATED UP TO 3 TIMES A DAY, OR MAY TAKE 3 AT ONCE IN [place]., Disp: 90 capsule, Rfl: 1 .  hydrochlorothiazide (HYDRODIURIL) 12.5 MG tablet, Take by mouth., Disp: , Rfl:  .  lisinopril (ZESTRIL) 20 MG tablet, TAKE 1 TABLET BY MOUTH EVERY DAY, Disp: 90 tablet, Rfl: 1 .  Multiple Vitamin (MULTI-VITAMINS) TABS, Take by mouth., Disp: , Rfl:  .  naproxen (NAPROSYN) 500 MG tablet, TAKE 1 TABLET (500 MG TOTAL) BY MOUTH 2 (TWO) TIMES DAILY WITH A MEAL. FOR 1-2 WEEKS THEN AS NEEDED, Disp: 60 tablet, Rfl: 1 .  valACYclovir (VALTREX) 1000 MG tablet, TAKE 1 TABLET (1,000 MG TOTAL) BY MOUTH DAILY AS NEEDED. FOR FLARE UP FOR 5 DAYS THEN STOP, Disp: 20 tablet, Rfl: 1 .  diclofenac (VOLTAREN) 75 MG EC tablet, PLEASE SEE ATTACHED FOR DETAILED DIRECTIONS, Disp: , Rfl:  .  sildenafil (REVATIO) 20 MG tablet, Take 1-5 pills about 30 min prior to sex. Start with 1 and increase as needed., Disp: 30 tablet, Rfl: 3  Depression screen [facility] 2/9 03/15/2019 01/24/2019 01/14/2019  Decreased Interest 0 0 0  Down, Depressed, Hopeless 0 0 0  PHQ - 2 Score 0 0 0    No flowsheet data found.  -------------------------------------------------------------------------- O: No physical exam performed due to remote telephone encounter.  Lab results reviewed.  Recent Results (from the past 2160 hour(s))  POCT HgB  A1C     Status: Abnormal   Collection Time: 03/15/19  8:15 AM  Result Value Ref Range   Hemoglobin A1C 6.4 (A) 4.0 - 5.6 %  SARS CORONAVIRUS 2 (TAT 6-24 HRS) Nasopharyngeal Nasopharyngeal Swab     Status: None   Collection Time: 04/08/19 10:38 AM   Specimen: Nasopharyngeal Swab  Result Value Ref Range   SARS Coronavirus 2 NEGATIVE NEGATIVE    Comment: (NOTE) SARS-CoV-2 target nucleic acids are NOT DETECTED. The  SARS-CoV-2 RNA is generally detectable in upper and lower respiratory specimens during the acute phase of infection. Negative results do not preclude SARS-CoV-2 infection, do not rule out co-infections with other pathogens, and should not be used as the sole basis for treatment or other patient management decisions. Negative results must be combined with clinical observations, patient history, and epidemiological information. The expected result is Negative. Fact Sheet for Patients: HairSlick.no Fact Sheet for Healthcare Providers: quierodirigir.com This test is not yet approved or cleared by the [place] FDA and  has been authorized for detection and/or diagnosis of SARS-CoV-2 by FDA under an Emergency Use Authorization (EUA). This EUA will remain  in effect (meaning this test can be used) for the duration of the COVID-19 declaration under Section 56 4(b)(1) of the Act, 21 U.S.C. section 360bbb-3(b)(1), unless the authorization is terminated or revoked sooner. Performed at [facility], [address] [place], [place] [address]   Urine Drug Screen, Qualitative ([facility] only)     Status: None   Collection Time: 04/10/19 10:15 AM  Result Value Ref Range   Tricyclic, Ur Screen NONE DETECTED NONE DETECTED   Amphetamines, Ur Screen NONE DETECTED NONE DETECTED   MDMA (Ecstasy)Ur Screen NONE DETECTED NONE DETECTED   Cocaine Metabolite,Ur [place] NONE DETECTED NONE DETECTED   Opiate, Ur Screen NONE DETECTED NONE DETECTED   Phencyclidine (PCP) Ur S NONE DETECTED NONE DETECTED   Cannabinoid 50 Ng, Ur [place] NONE DETECTED NONE DETECTED   Barbiturates, Ur Screen NONE DETECTED NONE DETECTED   Benzodiazepine, Ur Scrn NONE DETECTED NONE DETECTED   Methadone Scn, Ur NONE DETECTED NONE DETECTED    Comment: (NOTE) Tricyclics + metabolites, urine    Cutoff 1000 ng/mL Amphetamines + metabolites, urine  Cutoff 1000 ng/mL MDMA (Ecstasy), urine               Cutoff 500 ng/mL Cocaine Metabolite, urine          Cutoff 300 ng/mL Opiate + metabolites, urine        Cutoff 300 ng/mL Phencyclidine (PCP), urine         Cutoff 25 ng/mL Cannabinoid, urine                 Cutoff 50 ng/mL Barbiturates + metabolites, urine  Cutoff 200 ng/mL Benzodiazepine, urine              Cutoff 200 ng/mL Methadone, urine                   Cutoff 300 ng/mL The urine drug screen provides only a preliminary, unconfirmed analytical test result and should not be used for non-medical purposes. Clinical consideration and professional judgment should be applied to any positive drug screen result due to possible interfering substances. A more specific alternate chemical method must be used in order to obtain a confirmed analytical result. Gas chromatography / mass spectrometry (GC/MS) is the preferred confirmat ory method. Performed at [facility], [address], [place], [place] [address]     --------------------------------------------------------------------------  A&P:  Problem List Items Addressed This Visit    None    Visit Diagnoses    Drug-induced erectile dysfunction    -  Primary   Relevant Medications   sildenafil (REVATIO) 20 MG tablet     Consistent with likely drug induced (BP meds) vs age-related ED Risk factor with HTN - no prior trial on PDE5 inhibitors  Plan: 1. Trial on generic Sildenafil 20mg  tabs, take 1-5 tabs about 30 min prior to sexual activity, refills provided 2. Follow-up as needed   Meds ordered this encounter  Medications  . sildenafil (REVATIO) 20 MG tablet    Sig: Take 1-5 pills about 30 min prior to sex. Start with 1 and increase as needed.    Dispense:  30 tablet    Refill:  3    Follow-up: - Return as needed  Patient verbalizes understanding with the above medical recommendations including the limitation of remote medical advice.  Specific follow-up and call-back criteria were given for patient  to follow-up or seek medical care more urgently if needed.   - Time spent in direct consultation with patient on phone: 9 minutes   , DO [facility] 05/24/2019, 8:35 AM

## 2019-06-02 ENCOUNTER — Other Ambulatory Visit: Payer: Self-pay | Admitting: Family Medicine

## 2019-06-02 DIAGNOSIS — M25512 Pain in left shoulder: Secondary | ICD-10-CM

## 2019-06-02 DIAGNOSIS — R202 Paresthesia of skin: Secondary | ICD-10-CM

## 2019-06-02 DIAGNOSIS — I10 Essential (primary) hypertension: Secondary | ICD-10-CM

## 2019-06-02 DIAGNOSIS — S29012D Strain of muscle and tendon of back wall of thorax, subsequent encounter: Secondary | ICD-10-CM

## 2019-06-02 NOTE — Telephone Encounter (Signed)
Requested Prescriptions  Pending Prescriptions Disp Refills  . lisinopril (ZESTRIL) 20 MG tablet [Pharmacy Med Name: LISINOPRIL 20 MG TABLET] 90 tablet 1    Sig: TAKE 1 TABLET BY MOUTH EVERY DAY     Cardiovascular:  ACE Inhibitors Failed - 06/02/2019  2:27 PM      Failed - Cr in normal range and within 180 days    Creat  Date Value Ref Range Status  08/13/2018 1.08 0.70 - 1.33 mg/dL Final    Comment:    For patients >60 years of age, the reference limit for Creatinine is approximately 13% higher for people identified as African-American. .          Failed - K in normal range and within 180 days    Potassium  Date Value Ref Range Status  08/13/2018 4.4 3.5 - 5.3 mmol/L Final         Passed - Patient is not pregnant      Passed - Last BP in normal range    BP Readings from Last 1 Encounters:  04/10/19 130/86         Passed - Valid encounter within last 6 months    Recent Outpatient Visits          1 week ago Drug-induced erectile dysfunction   [facility] [place], Netta Neat, DO   2 months ago Pre-diabetes   [facility] [place], Netta Neat, DO   3 months ago Paresthesia of left upper extremity   [facility] [place], Netta Neat, DO   4 months ago Muscle strain of left upper back, subsequent encounter   [facility] [place], Netta Neat, DO   4 months ago Muscle strain of left upper back, initial encounter   [facility], Netta Neat, DO             . gabapentin (NEURONTIN) 100 MG capsule [Pharmacy Med Name: GABAPENTIN 100 MG CAPSULE] 270 capsule 1    Sig: START 1 CAPSULE DAILY, INCREASE BY 1 CAP EVERY 2-3 DAYS AS TOLERATED UP TO 3 TIMES A DAY, OR MAY TAKE 3 AT ONCE IN [place].     Neurology: Anticonvulsants - gabapentin Passed - 06/02/2019  2:27 PM      Passed - Valid encounter within last 12 months    Recent Outpatient Visits          1 week ago  Drug-induced erectile dysfunction   [facility] [place], Netta Neat, DO   2 months ago Pre-diabetes   [facility] [place], Netta Neat, DO   3 months ago Paresthesia of left upper extremity   [facility] [place], Netta Neat, DO   4 months ago Muscle strain of left upper back, subsequent encounter   [facility] [place], Netta Neat, DO   4 months ago Muscle strain of left upper back, initial encounter   [facility] [place], Netta Neat, [place]

## 2019-09-09 ENCOUNTER — Other Ambulatory Visit: Payer: BC Managed Care – PPO

## 2019-09-09 ENCOUNTER — Other Ambulatory Visit: Payer: Self-pay

## 2019-09-09 DIAGNOSIS — E669 Obesity, unspecified: Secondary | ICD-10-CM

## 2019-09-09 DIAGNOSIS — I10 Essential (primary) hypertension: Secondary | ICD-10-CM

## 2019-09-09 DIAGNOSIS — R7303 Prediabetes: Secondary | ICD-10-CM

## 2019-09-09 DIAGNOSIS — Z0000 Encounter for general adult medical examination without abnormal findings: Secondary | ICD-10-CM

## 2019-09-09 DIAGNOSIS — R351 Nocturia: Secondary | ICD-10-CM

## 2019-09-10 LAB — COMPLETE METABOLIC PANEL WITH GFR
AG Ratio: 1.5 (calc) (ref 1.0–2.5)
ALT: 23 U/L (ref 9–46)
AST: 31 U/L (ref 10–35)
Albumin: 4.4 g/dL (ref 3.6–5.1)
Alkaline phosphatase (APISO): 49 U/L (ref 35–144)
BUN: 21 mg/dL (ref 7–25)
CO2: 24 mmol/L (ref 20–32)
Calcium: 9.9 mg/dL (ref 8.6–10.3)
Chloride: 100 mmol/L (ref 98–110)
Creat: 1.02 mg/dL (ref 0.70–1.33)
GFR, Est African American: 93 mL/min/{1.73_m2} (ref >=60)
GFR, Est Non African American: 80 mL/min/{1.73_m2} (ref >=60)
Globulin: 3 g/dL (calc) (ref 1.9–3.7)
Glucose, Bld: 63 mg/dL — ABNORMAL LOW (ref 65–99)
Potassium: 4.4 mmol/L (ref 3.5–5.3)
Sodium: 137 mmol/L (ref 135–146)
Total Bilirubin: 0.6 mg/dL (ref 0.2–1.2)
Total Protein: 7.4 g/dL (ref 6.1–8.1)

## 2019-09-10 LAB — CBC WITH DIFFERENTIAL/PLATELET
Absolute Monocytes: 348 cells/uL (ref 200–950)
Basophils Absolute: 30 cells/uL (ref 0–200)
Basophils Relative: 1.1 %
Eosinophils Absolute: 189 cells/uL (ref 15–500)
Eosinophils Relative: 7 %
HCT: 45.2 % (ref 38.5–50.0)
Hemoglobin: 14.6 g/dL (ref 13.2–17.1)
Lymphs Abs: 1245 cells/uL (ref 850–3900)
MCH: 27.3 pg (ref 27.0–33.0)
MCHC: 32.3 g/dL (ref 32.0–36.0)
MCV: 84.5 fL (ref 80.0–100.0)
MPV: 9.1 fL (ref 7.5–12.5)
Monocytes Relative: 12.9 %
Neutro Abs: 888 cells/uL — ABNORMAL LOW (ref 1500–7800)
Neutrophils Relative %: 32.9 %
Platelets: 173 10*3/uL (ref 140–400)
RBC: 5.35 10*6/uL (ref 4.20–5.80)
RDW: 13.6 % (ref 11.0–15.0)
Total Lymphocyte: 46.1 %
WBC: 2.7 10*3/uL — ABNORMAL LOW (ref 3.8–10.8)

## 2019-09-10 LAB — LIPID PANEL
Cholesterol: 167 mg/dL (ref <200)
HDL: 72 mg/dL (ref >=40)
LDL Cholesterol (Calc): 81 mg/dL (calc)
Non-HDL Cholesterol (Calc): 95 mg/dL (calc) (ref <130)
Total CHOL/HDL Ratio: 2.3 (calc) (ref <5.0)
Triglycerides: 62 mg/dL (ref <150)

## 2019-09-10 LAB — HEMOGLOBIN A1C
Hgb A1c MFr Bld: 6.2 % of total Hgb — ABNORMAL HIGH (ref <5.7)
Mean Plasma Glucose: 131 (calc)
eAG (mmol/L): 7.3 (calc)

## 2019-09-10 LAB — PSA: PSA: 0.3 ng/mL (ref <=4.0)

## 2019-09-13 ENCOUNTER — Other Ambulatory Visit: Payer: Self-pay

## 2019-09-13 ENCOUNTER — Encounter: Payer: Self-pay | Admitting: Family Medicine

## 2019-09-13 ENCOUNTER — Ambulatory Visit (INDEPENDENT_AMBULATORY_CARE_PROVIDER_SITE_OTHER): Payer: BC Managed Care – PPO | Admitting: Family Medicine

## 2019-09-13 VITALS — BP 109/66 | HR 85 | Temp 97.5°F | Resp 16 | Ht 72.0 in | Wt 207.0 lb

## 2019-09-13 DIAGNOSIS — Z0000 Encounter for general adult medical examination without abnormal findings: Secondary | ICD-10-CM

## 2019-09-13 DIAGNOSIS — I10 Essential (primary) hypertension: Secondary | ICD-10-CM

## 2019-09-13 DIAGNOSIS — E663 Overweight: Secondary | ICD-10-CM

## 2019-09-13 DIAGNOSIS — R7303 Prediabetes: Secondary | ICD-10-CM | POA: Diagnosis not present

## 2019-09-13 NOTE — Progress Notes (Signed)
Subjective:    Patient ID: Kyle Buchanan, male    DOB: [birth year], 59 y.o.   MRN: [id number]  Kyle Buchanan is a 58 y.o. male presenting on 09/13/2019 for Annual Exam   HPI   Here for Annual Physical and Lab Review  Pre-Diabetes / Overweight BMI >28 Prior A1c up to 6.4, now recent lab improved 6.2 Improving lifestyle diet CBGs:Not checking CBG Meds:Never on med Currentlynot onACEi / ARB Lifestyle: - weight loss 25 lbs in 6 months - Diet (Improved diet no bread now, healthy options - reduced sweets) - Exercise (He does walking and exercising back on bike) Denies hypoglycemia, polyuria, visual changes, numbness or tingling.  CHRONIC HTN See above lifestyle Remains off Amlodipine. He seems to have resumed HCTZ Today he is doing well, BP is controlled Taking Lisinopril 20mg  daily, HCTZ 12.5mg  daily Reports good compliance, took meds today. Tolerating well, w/o complaints. Denies CP, dyspnea, HA, edema, dizziness / lightheadedness  HYPERLIPIDEMIA: - Reports no concerns. Last lipid panel 08/2019, controlled on lifestyle  Erectile Dysfunction Improved, had sildenafil but now rarely using it. He is doing much better.  Additionally - followed by Neurology 09/2019) Dr Gavin Potters - Admits irritable on Lyrica, off Gabapentin  Health Maintenance:  Prostate CA Screening: Last PSA 0.3 (08/2019). Currently asymptomatic BPH LUTS. No known family history of prostate CA.   Colon CA Screening: Last Colonoscopy 04/10/19 (done by [facility] Dr 04/12/19, had one prior colonoscopy 2012, results with NO polyps, good for 7 years. Currently asymptomatic. No known family history of colon CA  Due for Flu vaccine, will return in 1 week.  UTD COVID vaccine   Depression screen [facility] 2/9 09/13/2019 03/15/2019 01/24/2019  Decreased Interest 0 0 0  Down, Depressed, Hopeless 0 0 0  PHQ - 2 Score 0 0 0    Past Medical History:  Diagnosis Date  . Hepatitis C    Past Surgical History:    Procedure Laterality Date  . COLONOSCOPY WITH PROPOFOL N/A 04/10/2019   Procedure: COLONOSCOPY WITH PROPOFOL;  Surgeon: 04/12/2019, MD;  Location[facility] ENDOSCOPY;  Service: Gastroenterology;  Laterality: N/A;  PRIORITY 4   Social History   Socioeconomic History  . Marital status: Married    Spouse name: Not on file  . Number of children: Not on file  . Years of education: Not on file  . Highest education level: Not on file  Occupational History  . Not on file  Tobacco Use  . Smoking status: Former Smoker    Packs/day: 1.00    Years: 9.00    Pack years: 9.00    Types: Cigarettes  . Smokeless tobacco: Former [facility]  . Vaping Use: Never used  Substance and Sexual Activity  . Alcohol use: Not Currently    Comment: past  . Drug use: Not Currently    Types: Marijuana, Cocaine    Comment: past   . Sexual activity: Not on file  Other Topics Concern  . Not on file  Social History Narrative  . Not on file   Social Determinants of Health   Financial Resource Strain:   . Difficulty of Paying Living Expenses: Not on file  Food Insecurity:   . Worried About Clinical biochemist in the Last Year: Not on file  . Ran Out of Food in the Last Year: Not on file  Transportation Needs:   . Lack of Transportation (Medical): Not on file  . Lack of Transportation (Non-Medical):  Not on file  Physical Activity:   . Days of Exercise per Week: Not on file  . Minutes of Exercise per Session: Not on file  Stress:   . Feeling of Stress : Not on file  Social Connections:   . Frequency of Communication with Friends and Family: Not on file  . Frequency of Social Gatherings with Friends and Family: Not on file  . Attends Religious Services: Not on file  . Active Member of Clubs or Organizations: Not on file  . Attends Banker Meetings: Not on file  . Marital Status: Not on file  Intimate Partner Violence:   . Fear of Current or Ex-Partner: Not on file  .  Emotionally Abused: Not on file  . Physically Abused: Not on file  . Sexually Abused: Not on file   Family History  Problem Relation Age of Onset  . Cancer Father        bone cancer  . Cancer Sister        uncertain type   Current Outpatient Medications on File Prior to Visit  Medication Sig  . diclofenac (VOLTAREN) 75 MG EC tablet PLEASE SEE ATTACHED FOR DETAILED DIRECTIONS  . lisinopril (ZESTRIL) 20 MG tablet TAKE 1 TABLET BY MOUTH EVERY DAY  . Multiple Vitamin (MULTI-VITAMINS) TABS Take by mouth.  . naproxen (NAPROSYN) 500 MG tablet TAKE 1 TABLET (500 MG TOTAL) BY MOUTH 2 (TWO) TIMES DAILY WITH A MEAL. FOR 1-2 WEEKS THEN AS NEEDED  . pregabalin (LYRICA) 25 MG capsule Take by mouth.  . sildenafil (REVATIO) 20 MG tablet Take 1-5 pills about 30 min prior to sex. Start with 1 and increase as needed.  . valACYclovir (VALTREX) 1000 MG tablet TAKE 1 TABLET (1,000 MG TOTAL) BY MOUTH DAILY AS NEEDED. FOR FLARE UP FOR 5 DAYS THEN STOP   No current facility-administered medications on file prior to visit.    Review of Systems  Constitutional: Negative for activity change, appetite change, chills, diaphoresis, fatigue and fever.  HENT: Negative for congestion and hearing loss.   Eyes: Negative for visual disturbance.  Respiratory: Negative for apnea, cough, chest tightness, shortness of breath and wheezing.   Cardiovascular: Negative for chest pain, palpitations and leg swelling.  Gastrointestinal: Negative for abdominal pain, constipation, diarrhea, nausea and vomiting.  Endocrine: Negative for cold intolerance.  Genitourinary: Negative for difficulty urinating, dysuria, frequency and hematuria.  Musculoskeletal: Negative for arthralgias and neck pain.  Skin: Negative for rash.  Allergic/Immunologic: Negative for environmental allergies.  Neurological: Negative for dizziness, weakness, light-headedness, numbness and headaches.  Hematological: Negative for adenopathy.    Psychiatric/Behavioral: Negative for behavioral problems, dysphoric mood and sleep disturbance.   Per HPI unless specifically indicated above      Objective:    BP 109/66   Pulse 85   Temp (!) 97.5 F (36.4 C) (Temporal)   Resp 16   Ht 6' (1.829 m)   Wt 207 lb (93.9 kg)   SpO2 100%   BMI 28.07 kg/m   Wt Readings from Last 3 Encounters:  09/13/19 207 lb (93.9 kg)  04/10/19 221 lb (100.2 kg)  03/15/19 232 lb (105.2 kg)    Physical Exam Vitals and nursing note reviewed.  Constitutional:      General: He is not in acute distress.    Appearance: He is well-developed. He is not diaphoretic.     Comments: Well-appearing, comfortable, cooperative  HENT:     Head: Normocephalic and atraumatic.  Eyes:  General:        Right eye: No discharge.        Left eye: No discharge.     Conjunctiva/sclera: Conjunctivae normal.     Pupils: Pupils are equal, round, and reactive to light.  Neck:     Thyroid: No thyromegaly.     Vascular: No carotid bruit.  Cardiovascular:     Rate and Rhythm: Normal rate and regular rhythm.     Heart sounds: Normal heart sounds. No murmur heard.   Pulmonary:     Effort: Pulmonary effort is normal. No respiratory distress.     Breath sounds: Normal breath sounds. No wheezing or rales.  Abdominal:     General: Bowel sounds are normal. There is no distension.     Palpations: Abdomen is soft. There is no mass.     Tenderness: There is no abdominal tenderness.  Musculoskeletal:        General: No tenderness. Normal range of motion.     Cervical back: Normal range of motion and neck supple.     Right lower leg: No edema.     Left lower leg: No edema.     Comments: Upper / Lower Extremities: - Normal muscle tone, strength bilateral upper extremities 5/5, lower extremities 5/5  Lymphadenopathy:     Cervical: No cervical adenopathy.  Skin:    General: Skin is warm and dry.     Findings: No erythema or rash.  Neurological:     Mental Status: He is  alert and oriented to person, place, and time.     Comments: Distal sensation intact to light touch all extremities  Psychiatric:        Behavior: Behavior normal.     Comments: Well groomed, good eye contact, normal speech and thoughts      Recent Labs    03/15/19 0815 09/09/19 0820  HGBA1C 6.4* 6.2*     Results for orders placed or performed in visit on 09/09/19  Hemoglobin A1c  Result Value Ref Range   Hgb A1c MFr Bld 6.2 (H) <5.7 % of total Hgb   Mean Plasma Glucose 131 (calc)   eAG (mmol/L) 7.3 (calc)  PSA  Result Value Ref Range   PSA 0.3 < OR = 4.0 ng/mL  Lipid panel  Result Value Ref Range   Cholesterol 167 <200 mg/dL   HDL 72 > OR = 40 mg/dL   Triglycerides 62 <867 mg/dL   LDL Cholesterol (Calc) 81 mg/dL (calc)   Total CHOL/HDL Ratio 2.3 <5.0 (calc)   Non-HDL Cholesterol (Calc) 95 <619 mg/dL (calc)  COMPLETE METABOLIC PANEL WITH GFR  Result Value Ref Range   Glucose, Bld 63 (L) 65 - 99 mg/dL   BUN 21 7 - 25 mg/dL   Creat 5.09 3.26 - 7.12 mg/dL   GFR, Est Non African American 80 > OR = 60 mL/min/1.85m2   GFR, Est African American 93 > OR = 60 mL/min/1.16m2   BUN/Creatinine Ratio NOT APPLICABLE 6 - 22 (calc)   Sodium 137 135 - 146 mmol/L   Potassium 4.4 3.5 - 5.3 mmol/L   Chloride 100 98 - 110 mmol/L   CO2 24 20 - 32 mmol/L   Calcium 9.9 8.6 - 10.3 mg/dL   Total Protein 7.4 6.1 - 8.1 g/dL   Albumin 4.4 3.6 - 5.1 g/dL   Globulin 3.0 1.9 - 3.7 g/dL (calc)   AG Ratio 1.5 1.0 - 2.5 (calc)   Total Bilirubin 0.6 0.2 - 1.2 mg/dL  Alkaline phosphatase (APISO) 49 35 - 144 U/L   AST 31 10 - 35 U/L   ALT 23 9 - 46 U/L  CBC with Differential/Platelet  Result Value Ref Range   WBC 2.7 (L) 3.8 - 10.8 Thousand/uL   RBC 5.35 4.20 - 5.80 Million/uL   Hemoglobin 14.6 13.2 - 17.1 g/dL   HCT 16.145.2 38 - 50 %   MCV 84.5 80.0 - 100.0 fL   MCH 27.3 27.0 - 33.0 pg   MCHC 32.3 32.0 - 36.0 g/dL   RDW 09.613.6 04.511.0 - 40.915.0 %   Platelets 173 140 - 400 Thousand/uL   MPV 9.1 7.5 -  12.5 fL   Neutro Abs 888 (L) 1,500 - 7,800 cells/uL   Lymphs Abs 1,245 850 - 3,900 cells/uL   Absolute Monocytes 348 200 - 950 cells/uL   Eosinophils Absolute 189 15 - 500 cells/uL   Basophils Absolute 30 0 - 200 cells/uL   Neutrophils Relative % 32.9 %   Total Lymphocyte 46.1 %   Monocytes Relative 12.9 %   Eosinophils Relative 7.0 %   Basophils Relative 1.1 %      Assessment & Plan:   Problem List Items Addressed This Visit    Pre-diabetes    Improved A1c to 6.2, improved diet  Plan:  1. Not on any therapy currently  2. Encourage improved lifestyle - low carb, low sugar diet, reduce portion size, continue improving regular exercise  Defer medication, keep emphasis on lifestyle and return 6 months      Overweight (BMI 25.0-29.9)    Encourage lifestyle      Essential hypertension    Controlled BP, low normal Home BP normal No known complications OFF Amlodipine 5-10mg  due to pedal edema    Plan:  1. Continue Lisinopril 20mg  daily - he has resumed HCTZ 12.5mg , he may trial off of this again to see if needed 2. Encourage improved lifestyle - low sodium diet, regular exercise 3. Continue monitor BP outside office, bring readings to next visit, if persistently >140/90 or new symptoms notify office sooner       Other Visit Diagnoses    Annual physical exam    -  Primary      Updated Health Maintenance information Reviewed recent lab results with patient Encouraged improvement to lifestyle with diet and exercise - Goal of weight loss   No orders of the defined types were placed in this encounter.     Follow up plan: Return for return Thurs 9/2 for flu shot, then Follow-up 6 month PreDM A1c, HTN.  Saralyn PilarAlexander Sapphira Harjo, DO [facility] 09/13/2019, 9:13 AM

## 2019-09-13 NOTE — Patient Instructions (Addendum)
Thank you for coming to the office today.  Please schedule and return for a NURSE ONLY VISIT for VACCINE - Approximately around September / October - Need Regular Dose Flu Vaccine  Recent Labs    03/15/19 0815 09/09/19 0820  HGBA1C 6.4* 6.2*   Very impressive - great job!  Trial OFF Hydrochlorothiazide 12.5mg  daily - this is the fluid BP pill, may no longer need this one.  Continue Lisinopril 20mg  daily.  If BP is elevated - let me know. May need to cut the Hydrochlorothiazide in half instead.   Please schedule a Follow-up Appointment to: Return for return Thurs 9/2 for flu shot, then Follow-up 6 month PreDM A1c.  If you have any other questions or concerns, please feel free to call the office or send a message through MyChart. You may also schedule an earlier appointment if necessary.  Additionally, you may be receiving a survey about your experience at our office within a few days to 1 week by e-mail or mail. We value your feedback.  11/2, DO [facility], [facility]

## 2019-09-14 NOTE — Assessment & Plan Note (Signed)
Improved A1c to 6.2, improved diet  Plan:  1. Not on any therapy currently  2. Encourage improved lifestyle - low carb, low sugar diet, reduce portion size, continue improving regular exercise  Defer medication, keep emphasis on lifestyle and return 6 months

## 2019-09-14 NOTE — Assessment & Plan Note (Addendum)
Controlled BP, low normal Home BP normal No known complications OFF Amlodipine 5-10mg  due to pedal edema    Plan:  1. Continue Lisinopril 20mg  daily - he has resumed HCTZ 12.5mg , he may trial off of this again to see if needed 2. Encourage improved lifestyle - low sodium diet, regular exercise 3. Continue monitor BP outside office, bring readings to next visit, if persistently >140/90 or new symptoms notify office sooner

## 2019-09-14 NOTE — Assessment & Plan Note (Signed)
Encourage lifestyle 

## 2019-09-19 ENCOUNTER — Ambulatory Visit: Payer: BC Managed Care – PPO

## 2019-12-14 ENCOUNTER — Other Ambulatory Visit: Payer: Self-pay | Admitting: Family Medicine

## 2019-12-14 DIAGNOSIS — I10 Essential (primary) hypertension: Secondary | ICD-10-CM

## 2019-12-14 NOTE — Telephone Encounter (Signed)
Requested Prescriptions  Pending Prescriptions Disp Refills  . lisinopril (ZESTRIL) 20 MG tablet [Pharmacy Med Name: LISINOPRIL 20 MG TABLET] 90 tablet 0    Sig: TAKE 1 TABLET BY MOUTH EVERY DAY     Cardiovascular:  ACE Inhibitors Passed - 12/14/2019  2:48 PM      Passed - Cr in normal range and within 180 days    Creat  Date Value Ref Range Status  09/09/2019 1.02 0.70 - 1.33 mg/dL Final    Comment:    For patients >60 years of age, the reference limit for Creatinine is approximately 13% higher for people identified as African-American. .          Passed - K in normal range and within 180 days    Potassium  Date Value Ref Range Status  09/09/2019 4.4 3.5 - 5.3 mmol/L Final         Passed - Patient is not pregnant      Passed - Last BP in normal range    BP Readings from Last 1 Encounters:  09/13/19 109/66         Passed - Valid encounter within last 6 months    Recent Outpatient Visits          3 months ago Annual physical exam   [facility] Smitty Cords, DO   6 months ago Drug-induced erectile dysfunction   [facility] S[facility], DO   9 months ago Pre-diabetes   [facility] Smit[facility] Cords, DO   10 months ago Paresthesia of left upper extremity   [facility] [place], Netta Neat, DO   10 months ago Muscle strain of left upper back, subsequent encounter   [facility] [place], Netta Neat, [place]

## 2019-12-14 NOTE — Telephone Encounter (Signed)
Requested Prescriptions  Pending Prescriptions Disp Refills  . lisinopril (ZESTRIL) 20 MG tablet [Pharmacy Med Name: LISINOPRIL 20 MG TABLET] 90 tablet 0    Sig: TAKE 1 TABLET BY MOUTH EVERY DAY     Cardiovascular:  ACE Inhibitors Passed - 12/14/2019  2:48 PM      Passed - Cr in normal range and within 180 days    Creat  Date Value Ref Range Status  09/09/2019 1.02 0.70 - 1.33 mg/dL Final    Comment:    For patients >60 years of age, the reference limit for Creatinine is approximately 13% higher for people identified as African-American. .          Passed - K in normal range and within 180 days    Potassium  Date Value Ref Range Status  09/09/2019 4.4 3.5 - 5.3 mmol/L Final         Passed - Patient is not pregnant      Passed - Last BP in normal range    BP Readings from Last 1 Encounters:  09/13/19 109/66         Passed - Valid encounter within last 6 months    Recent Outpatient Visits          3 months ago Annual physical exam   [facility] [place], Alexander J, DO   6 months ago Drug-induced erectile dysfunction   [facility] [place], Alexander J, DO   9 months ago Pre-diabetes   [facility] [place], Alexander J, DO   10 months ago Paresthesia of left upper extremity   [facility] [place], Alexander J, DO   10 months ago Muscle strain of left upper back, subsequent encounter   [facility] [place], Alexander J, DO               

## 2020-03-08 ENCOUNTER — Other Ambulatory Visit: Payer: Self-pay | Admitting: Family Medicine

## 2020-03-08 DIAGNOSIS — I10 Essential (primary) hypertension: Secondary | ICD-10-CM

## 2020-05-18 ENCOUNTER — Other Ambulatory Visit: Payer: Self-pay | Admitting: Family Medicine

## 2020-05-18 ENCOUNTER — Encounter: Payer: Self-pay | Admitting: Family Medicine

## 2020-05-18 ENCOUNTER — Other Ambulatory Visit: Payer: Self-pay

## 2020-05-18 ENCOUNTER — Ambulatory Visit (INDEPENDENT_AMBULATORY_CARE_PROVIDER_SITE_OTHER): Payer: BC Managed Care – PPO | Admitting: Family Medicine

## 2020-05-18 VITALS — BP 126/85 | HR 90 | Ht 72.5 in | Wt 203.2 lb

## 2020-05-18 DIAGNOSIS — Z0000 Encounter for general adult medical examination without abnormal findings: Secondary | ICD-10-CM

## 2020-05-18 DIAGNOSIS — E663 Overweight: Secondary | ICD-10-CM

## 2020-05-18 DIAGNOSIS — I10 Essential (primary) hypertension: Secondary | ICD-10-CM

## 2020-05-18 DIAGNOSIS — M19041 Primary osteoarthritis, right hand: Secondary | ICD-10-CM | POA: Diagnosis not present

## 2020-05-18 DIAGNOSIS — M19042 Primary osteoarthritis, left hand: Secondary | ICD-10-CM | POA: Diagnosis not present

## 2020-05-18 DIAGNOSIS — R351 Nocturia: Secondary | ICD-10-CM

## 2020-05-18 DIAGNOSIS — R7303 Prediabetes: Secondary | ICD-10-CM

## 2020-05-18 MED ORDER — DICLOFENAC SODIUM 1 % EX GEL: 2.0000 g | Freq: Four times a day (QID) | CUTANEOUS | 3 refills | Status: DC | PRN

## 2020-05-18 NOTE — Progress Notes (Signed)
Subjective:    Patient ID: Kyle Buchanan, male    DOB: [birth year], 60 y.o.   MRN: [id number]  Kyle Buchanan is a 60 y.o. male presenting on 05/18/2020 for Hand Pain   HPI   Bilateral Hand Pain Reports recent flare with bilateral hand pain, R worse than L, mostly knuckles and index fingers, repetitive work recently with lifting. Pain occurred after with some pain and swelling in fingers and worse with grip in hands. He tried arthritis compression gloves  Today hand feels better. Worse with colder, rainy weather. 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  Denies any new numbness tingling weakness, redness swelling, injury  Depression screen [facility] 2/9 09/13/2019 03/15/2019 01/24/2019  Decreased Interest 0 0 0  Down, Depressed, Hopeless 0 0 0  PHQ - 2 Score 0 0 0    Social History   Tobacco Use  . Smoking status: Former Smoker    Packs/day: 1.00    Years: 9.00    Pack years: 9.00    Types: Cigarettes  . Smokeless tobacco: Former Clinical biochemist  . Vaping Use: Never used  Substance Use Topics  . Alcohol use: Not Currently    Comment: past  . Drug use: Not Currently    Types: Marijuana, Cocaine    Comment: past     Review of Systems Per HPI unless specifically indicated above     Objective:    BP 126/85   Pulse 90   Ht 6' 0.5" (1.842 m)   Wt 203 lb 3.2 oz (92.2 kg)   SpO2 99%   BMI 27.18 kg/m   Wt Readings from Last 3 Encounters:  05/18/20 203 lb 3.2 oz (92.2 kg)  09/13/19 207 lb (93.9 kg)  04/10/19 221 lb (100.2 kg)    Physical Exam   Results for orders placed or performed in visit on 09/09/19  Hemoglobin A1c  Result Value Ref Range   Hgb A1c MFr Bld 6.2 (H) <5.7 % of total Hgb   Mean Plasma Glucose 131 (calc)   eAG (mmol/L) 7.3 (calc)  PSA  Result Value Ref Range   PSA 0.3 < OR = 4.0 ng/mL  Lipid panel  Result Value Ref Range    Cholesterol 167 <200 mg/dL   HDL 72 > OR = 40 mg/dL   Triglycerides 62 <476 mg/dL   LDL Cholesterol (Calc) 81 mg/dL (calc)   Total CHOL/HDL Ratio 2.3 <5.0 (calc)   Non-HDL Cholesterol (Calc) 95 <546 mg/dL (calc)  COMPLETE METABOLIC PANEL WITH GFR  Result Value Ref Range   Glucose, Bld 63 (L) 65 - 99 mg/dL   BUN 21 7 - 25 mg/dL   Creat 5.03 5.46 - 5.68 mg/dL   GFR, Est Non African American 80 > OR = 60 mL/min/1.43m2   GFR, Est African American 93 > OR = 60 mL/min/1.20m2   BUN/Creatinine Ratio NOT APPLICABLE 6 - 22 (calc)   Sodium 137 135 - 146 mmol/L   Potassium 4.4 3.5 - 5.3 mmol/L   Chloride 100 98 - 110 mmol/L   CO2 24 20 - 32 mmol/L   Calcium 9.9 8.6 - 10.3 mg/dL   Total Protein 7.4 6.1 - 8.1 g/dL   Albumin 4.4 3.6 - 5.1 g/dL   Globulin 3.0 1.9 - 3.7 g/dL (calc)   AG Ratio 1.5 1.0 - 2.5 (calc)   Total Bilirubin 0.6  0.2 - 1.2 mg/dL   Alkaline phosphatase (APISO) 49 35 - 144 U/L   AST 31 10 - 35 U/L   ALT 23 9 - 46 U/L  CBC with Differential/Platelet  Result Value Ref Range   WBC 2.7 (L) 3.8 - 10.8 Thousand/uL   RBC 5.35 4.20 - 5.80 Million/uL   Hemoglobin 14.6 13.2 - 17.1 g/dL   HCT 96.2 22.9 - 79.8 %   MCV 84.5 80.0 - 100.0 fL   MCH 27.3 27.0 - 33.0 pg   MCHC 32.3 32.0 - 36.0 g/dL   RDW 92.1 19.4 - 17.4 %   Platelets 173 140 - 400 Thousand/uL   MPV 9.1 7.5 - 12.5 fL   Neutro Abs 888 (L) 1,500 - 7,800 cells/uL   Lymphs Abs 1,245 850 - 3,900 cells/uL   Absolute Monocytes 348 200 - 950 cells/uL   Eosinophils Absolute 189 15 - 500 cells/uL   Basophils Absolute 30 0 - 200 cells/uL   Neutrophils Relative % 32.9 %   Total Lymphocyte 46.1 %   Monocytes Relative 12.9 %   Eosinophils Relative 7.0 %   Basophils Relative 1.1 %      Assessment & Plan:   Problem List Items Addressed This Visit    Primary osteoarthritis of both hands - Primary   Relevant Medications   diclofenac Sodium (VOLTAREN) 1 % GEL      Clinically with mild flare of osteoarthritis R>L hand with  index finger MCP pain and symptoms Provoked by recent weather and repetitive activity  No other complication  Trial on topical Voltaren topical PRN 1-4 times daily as instructed for hand/finger joint arthritis pain    Meds ordered this encounter  Medications  . diclofenac Sodium (VOLTAREN) 1 % GEL    Sig: Apply 2 g topically 4 (four) times daily as needed (fingers, hand arthritis).    Dispense:  100 g    Refill:  3      Follow up plan: Return in about 4 months (around 09/18/2020) for 4 month fasting lab only then 1 week later Annual Physical.  Future labs ordered 09/15/20  Saralyn Pilar, DO [facility] 05/18/2020, 4:20 PM

## 2020-05-18 NOTE — Patient Instructions (Addendum)
Thank you for coming to the office today.  Osteoarthritis wear and tear of hands/fingers. Most likely flared up with cold rainy weather, and activity  START anti inflammatory topical - OTC Voltaren (generic Diclofenac) topical 2-4 times a day as needed for pain swelling of affected joint for 1-2 weeks or longer.  DUE for FASTING BLOOD WORK (no food or drink after midnight before the lab appointment, only water or coffee without cream/sugar on the morning of)  SCHEDULE "Lab Only" visit in the morning at the clinic for lab draw in 4 MONTHS   - Make sure Lab Only appointment is at about 1 week before your next appointment, so that results will be available  For Lab Results, once available within 2-3 days of blood draw, you can can log in to MyChart online to view your results and a brief explanation. Also, we can discuss results at next follow-up visit.    Please schedule a Follow-up Appointment to: Return in about 4 months (around 09/18/2020) for 4 month fasting lab only then 1 week later Annual Physical.  If you have any other questions or concerns, please feel free to call the office or send a message through MyChart. You may also schedule an earlier appointment if necessary.  Additionally, you may be receiving a survey about your experience at our office within a few days to 1 week by e-mail or mail. We value your feedback.  Saralyn Pilar, DO [facility], [place]

## 2020-06-11 ENCOUNTER — Other Ambulatory Visit: Payer: Self-pay | Admitting: Family Medicine

## 2020-06-11 DIAGNOSIS — I10 Essential (primary) hypertension: Secondary | ICD-10-CM

## 2020-06-11 NOTE — Telephone Encounter (Signed)
Requested Prescriptions  Pending Prescriptions Disp Refills  . lisinopril (ZESTRIL) 20 MG tablet [Pharmacy Med Name: LISINOPRIL 20 MG TABLET] 90 tablet 0    Sig: TAKE 1 TABLET BY MOUTH EVERY DAY     Cardiovascular:  ACE Inhibitors Failed - 06/11/2020  1:35 AM      Failed - Cr in normal range and within 180 days    Creat  Date Value Ref Range Status  09/09/2019 1.02 0.70 - 1.33 mg/dL Final    Comment:    For patients >61 years of age, the reference limit for Creatinine is approximately 13% higher for people identified as African-American. .          Failed - K in normal range and within 180 days    Potassium  Date Value Ref Range Status  09/09/2019 4.4 3.5 - 5.3 mmol/L Final         Passed - Patient is not pregnant      Passed - Last BP in normal range    BP Readings from Last 1 Encounters:  05/18/20 126/85         Passed - Valid encounter within last 6 months    Recent Outpatient Visits          3 weeks ago Primary osteoarthritis of both hands   [facility] Smitty Cords, DO   9 months ago Annual physical exam   [facility] Smitty Cords, DO   1 year ago Drug-induced erectile dysfunction   [facility] Smitty Cords, DO   1 year ago Pre-diabetes   [facility] [place], Netta Neat, DO   1 year ago Paresthesia of left upper extremity   [facility] [place], Netta Neat, DO

## 2020-07-21 ENCOUNTER — Other Ambulatory Visit: Payer: Self-pay | Admitting: Family Medicine

## 2020-07-21 ENCOUNTER — Other Ambulatory Visit: Payer: Self-pay

## 2020-07-21 ENCOUNTER — Encounter: Payer: Self-pay | Admitting: Family Medicine

## 2020-07-21 ENCOUNTER — Ambulatory Visit
Admission: RE | Admit: 2020-07-21 | Discharge: 2020-07-21 | Disposition: A | Discharge status: Home / Self Care | Payer: BC Managed Care – PPO | Attending: Family Medicine | Admitting: Family Medicine

## 2020-07-21 ENCOUNTER — Ambulatory Visit: Payer: BC Managed Care – PPO | Admitting: Family Medicine

## 2020-07-21 ENCOUNTER — Ambulatory Visit
Admission: RE | Admit: 2020-07-21 | Discharge: 2020-07-21 | Disposition: A | Discharge status: Home / Self Care | Payer: BC Managed Care – PPO | Source: Ambulatory Visit | Attending: Family Medicine | Admitting: Family Medicine

## 2020-07-21 VITALS — BP 130/85 | HR 83 | Ht 72.5 in | Wt 202.8 lb

## 2020-07-21 DIAGNOSIS — N522 Drug-induced erectile dysfunction: Secondary | ICD-10-CM

## 2020-07-21 DIAGNOSIS — M19042 Primary osteoarthritis, left hand: Secondary | ICD-10-CM | POA: Diagnosis not present

## 2020-07-21 DIAGNOSIS — M19041 Primary osteoarthritis, right hand: Secondary | ICD-10-CM

## 2020-07-21 DIAGNOSIS — H1132 Conjunctival hemorrhage, left eye: Secondary | ICD-10-CM | POA: Diagnosis not present

## 2020-07-21 DIAGNOSIS — S0502XA Injury of conjunctiva and corneal abrasion without foreign body, left eye, initial encounter: Secondary | ICD-10-CM

## 2020-07-21 MED ORDER — ERYTHROMYCIN 5 MG/GM OP OINT: 1.0000 "application " | TOPICAL_OINTMENT | Freq: Two times a day (BID) | OPHTHALMIC | 0 refills | Status: DC

## 2020-07-21 NOTE — Telephone Encounter (Signed)
   Notes to clinic:  this script has expired  Review for continued use and refill    Requested Prescriptions  Pending Prescriptions Disp Refills   sildenafil (REVATIO) 20 MG tablet [Pharmacy Med Name: SILDENAFIL 20 MG TABLET] 30 tablet 3    Sig: TAKE 1-5 TABS ABOUT 30 MIN PRIOR TO SEX. START WITH 1 AND INCREASE AS NEEDED.      Urology: Erectile Dysfunction Agents Passed - 07/21/2020  2:18 PM      Passed - Last BP in normal range    BP Readings from Last 1 Encounters:  07/21/20 130/85          Passed - Valid encounter within last 12 months    Recent Outpatient Visits           Today Conjunctival abrasion, left, initial encounter   [facility] [place], Netta Neat, DO   2 months ago Primary osteoarthritis of both hands   [facility] [place], Netta Neat, DO   10 months ago Annual physical exam   [facility] Smitty Cords, DO   1 year ago Drug-induced erectile dysfunction   [facility] Smitty Cords, DO   1 year ago Pre-diabetes   [facility] [place], Netta Neat, [place]

## 2020-07-21 NOTE — Patient Instructions (Addendum)
Thank you for coming to the office today.  X-ray today, stay tuned for result later this week.   Referral to [facility] [address], [place], [place] [address] Phone: [phone] [email or web address]   Stay tuned for apt - they will call you   Use antibiotic eye ointment twice a day for 1-2 weeks.  ----------  Other eye doctors locally if need  [facility]   Address: [address] [place], [place] [address] Phone: [phone]  Website: [email or web address]   [facility]  Address: [address] [place], [place], [place] [address] Phone: [phone]   [facility] [address] [place], [place] [address] Phone: [phone]  [facility] Address: [address] [place], [place], [place] [address]  Phone: [phone]   Please schedule a Follow-up Appointment to: Return if symptoms worsen or fail to improve.  If you have any other questions or concerns, please feel free to call the office or send a message through MyChart. You may also schedule an earlier appointment if necessary.  Additionally, you may be receiving a survey about your experience at our office within a few days to 1 week by e-mail or mail. We value your feedback.  Saralyn Pilar, DO [facility], [place]

## 2020-07-21 NOTE — Progress Notes (Signed)
Subjective:    Patient ID: Kyle Buchanan, male    DOB: 02-17-1960, 60 y.o.   MRN: [id number]  Kyle Buchanan is a 60 y.o. male presenting on 07/21/2020 for Eye Problem and Hand Pain   HPI  Left Conjunctival Eye Abrasion / Injury He does yearly eye check previously. Not lately Admits accidental scratch to Left eye with his hand, it hurt initially then felt better. He noticed later some blood under white of his eye. Still has that appearance, but it is no longer painful. Not affecting his vision at all.  Bilateral Hand Pain R hand > L hand arthritis pain Still chronic problem with hands Last visit ordered Voltaren and he tried topical voltaren limited relief Reports recent flare with bilateral hand pain, R worse than L, mostly knuckles and index fingers, repetitive work recently with lifting. Pain occurred after with some pain and swelling in fingers and worse with grip in hands. He tried arthritis compression gloves   He has history of nerve impingement and numbness from Left upper extremity, has been seen by Dr Malvin Johns at [facility] and treated with Nortriptyline.   Denies any new numbness tingling weakness, redness swelling, injury   Depression screen [facility] 2/9 07/21/2020 09/13/2019 03/15/2019  Decreased Interest 2 0 0  Down, Depressed, Hopeless 0 0 0  PHQ - 2 Score 2 0 0  Altered sleeping 0 - -  Tired, decreased energy 0 - -  Change in appetite 0 - -  Feeling bad or failure about yourself  0 - -  Trouble concentrating 0 - -  Moving slowly or fidgety/restless 0 - -  Suicidal thoughts 0 - -  PHQ-9 Score 2 - -  Difficult doing work/chores Not difficult at all - -    Social History   Tobacco Use   Smoking status: Former    Packs/day: 1.00    Years: 9.00    Pack years: 9.00    Types: Cigarettes   Smokeless tobacco: Former  Building services engineer Use: Never used  Substance Use Topics   Alcohol use: Not Currently    Comment: past   Drug use: Not Currently     Types: Marijuana, Cocaine    Comment: past     Review of Systems Per HPI unless specifically indicated above     Objective:    BP 130/85   Pulse 83   Ht 6' 0.5" (1.842 m)   Wt 202 lb 12.8 oz (92 kg)   SpO2 97%   BMI 27.13 kg/m   Wt Readings from Last 3 Encounters:  07/21/20 202 lb 12.8 oz (92 kg)  05/18/20 203 lb 3.2 oz (92.2 kg)  09/13/19 207 lb (93.9 kg)    Physical Exam Vitals and nursing note reviewed.  Constitutional:      General: He is not in acute distress.    Appearance: Normal appearance. He is well-developed. He is not diaphoretic.     Comments: Well-appearing, comfortable, cooperative  HENT:     Head: Normocephalic and atraumatic.  Eyes:     General:        Right eye: No discharge.        Left eye: No discharge.     Extraocular Movements: Extraocular movements intact.     Pupils: Pupils are equal, round, and reactive to light.     Comments: Left eye with inner aspect conjunctival abrasion seen under magnification/with ophthalmoscope, also with surrounding and lower aspect subconjunctival hemorrhage. No  involvement of cornea evident on exam.  Cardiovascular:     Rate and Rhythm: Normal rate.  Pulmonary:     Effort: Pulmonary effort is normal.  Musculoskeletal:     Comments: Right hand 1st finger MCP with some fullness swelling and bulky appearance. No other deformity.  Skin:    General: Skin is warm and dry.     Findings: No erythema or rash.  Neurological:     Mental Status: He is alert and oriented to person, place, and time.  Psychiatric:        Mood and Affect: Mood normal.        Behavior: Behavior normal.        Thought Content: Thought content normal.     Comments: Well groomed, good eye contact, normal speech and thoughts   Results for orders placed or performed in visit on 09/09/19  Hemoglobin A1c  Result Value Ref Range   Hgb A1c MFr Bld 6.2 (H) <5.7 % of total Hgb   Mean Plasma Glucose 131 (calc)   eAG (mmol/L) 7.3 (calc)  PSA  Result  Value Ref Range   PSA 0.3 < OR = 4.0 ng/mL  Lipid panel  Result Value Ref Range   Cholesterol 167 <200 mg/dL   HDL 72 > OR = 40 mg/dL   Triglycerides 62 <865 mg/dL   LDL Cholesterol (Calc) 81 mg/dL (calc)   Total CHOL/HDL Ratio 2.3 <5.0 (calc)   Non-HDL Cholesterol (Calc) 95 <784 mg/dL (calc)  COMPLETE METABOLIC PANEL WITH GFR  Result Value Ref Range   Glucose, Bld 63 (L) 65 - 99 mg/dL   BUN 21 7 - 25 mg/dL   Creat 6.96 2.95 - 2.84 mg/dL   GFR, Est Non African American 80 > OR = 60 mL/min/1.52m2   GFR, Est African American 93 > OR = 60 mL/min/1.12m2   BUN/Creatinine Ratio NOT APPLICABLE 6 - 22 (calc)   Sodium 137 135 - 146 mmol/L   Potassium 4.4 3.5 - 5.3 mmol/L   Chloride 100 98 - 110 mmol/L   CO2 24 20 - 32 mmol/L   Calcium 9.9 8.6 - 10.3 mg/dL   Total Protein 7.4 6.1 - 8.1 g/dL   Albumin 4.4 3.6 - 5.1 g/dL   Globulin 3.0 1.9 - 3.7 g/dL (calc)   AG Ratio 1.5 1.0 - 2.5 (calc)   Total Bilirubin 0.6 0.2 - 1.2 mg/dL   Alkaline phosphatase (APISO) 49 35 - 144 U/L   AST 31 10 - 35 U/L   ALT 23 9 - 46 U/L  CBC with Differential/Platelet  Result Value Ref Range   WBC 2.7 (L) 3.8 - 10.8 Thousand/uL   RBC 5.35 4.20 - 5.80 Million/uL   Hemoglobin 14.6 13.2 - 17.1 g/dL   HCT 13.2 44.0 - 10.2 %   MCV 84.5 80.0 - 100.0 fL   MCH 27.3 27.0 - 33.0 pg   MCHC 32.3 32.0 - 36.0 g/dL   RDW 72.5 36.6 - 44.0 %   Platelets 173 140 - 400 Thousand/uL   MPV 9.1 7.5 - 12.5 fL   Neutro Abs 888 (L) 1,500 - 7,800 cells/uL   Lymphs Abs 1,245 850 - 3,900 cells/uL   Absolute Monocytes 348 200 - 950 cells/uL   Eosinophils Absolute 189 15 - 500 cells/uL   Basophils Absolute 30 0 - 200 cells/uL   Neutrophils Relative % 32.9 %   Total Lymphocyte 46.1 %   Monocytes Relative 12.9 %   Eosinophils Relative 7.0 %  Basophils Relative 1.1 %      Assessment & Plan:   Problem List Items Addressed This Visit     Primary osteoarthritis of both hands   Relevant Orders   DG Hand Complete Right   Other  Visit Diagnoses     Conjunctival abrasion, left, initial encounter    -  Primary   Relevant Medications   erythromycin ophthalmic ointment   Other Relevant Orders   Ambulatory referral to Ophthalmology   Subconjunctival hemorrhage of left eye       Relevant Orders   Ambulatory referral to Ophthalmology       #R Hand Pain / Arthritis Chronic known OA/DJD of multiple joints including hands Will check R Hand X-ray today for further evaluation and follow up Trial on Voltaren unsuccessful May refer to ortho following X-ray  Urgent referral to ophthalmology for Left eye inner conjunctival abrasion from scratch with subconjunctival hemorrhage present. No involvement of cornea on initial exam. Not impacting vision. Not causing pain.  - Rx Erythromycin eye ophthal ointment BID 1-2 weeks, until see Ophtho   Orders Placed This Encounter  Procedures   DG Hand Complete Right    Standing Status:   Future    Number of Occurrences:   1    Standing Expiration Date:   07/21/2021    Order Specific Question:   Reason for Exam (SYMPTOM  OR DIAGNOSIS REQUIRED)    Answer:   Right hand eval for arthritis gradual chronic pain 1st MCP joint and other MCP    Order Specific Question:   Preferred imaging location?    Answer:   [facility]   Ambulatory referral to Ophthalmology    Referral Priority:   Urgent    Referral Type:   Consultation    Referral Reason:   Specialty Services Required    Requested Specialty:   Ophthalmology    Number of Visits Requested:   1     Meds ordered this encounter  Medications   erythromycin ophthalmic ointment    Sig: Place 1 application into the left eye in the morning and at bedtime. For up to 1-2 weeks    Dispense:  3.5 g    Refill:  0      Follow up plan: Return if symptoms worsen or fail to improve.    Saralyn Pilar, DO [facility] 07/21/2020, 11:23 AM

## 2020-08-18 ENCOUNTER — Telehealth: Payer: Self-pay | Admitting: Family Medicine

## 2020-08-18 NOTE — Telephone Encounter (Signed)
Pt called and is requesting to know if there is a cheaper alternative to get his Sildenafil. He states that this is costing him $80 when it is usually $30. He states that the insurance has stopped covering it. Please advise.       [id number] [place], [place] - [address]  [address] [place] [address]  Phone: [phone] Fax: [phone]  Hours: Not open 24 hours

## 2020-08-19 NOTE — Telephone Encounter (Signed)
There is no cheaper alternative to Sildenafil.  Actually, I am surprised his insurance even paid for it.  When we ordered it back in May 2021, we discussed using www.goodrx.com for free coupon  That is what he should do now to get the best price.  [facility] at lowest with coupon.  He should go to [facility] instead, cost is $8 for 30 pills or $12 for 90 pills.  He can let me know which [facility] to [facility] to and he should use a www.goodrx.com coupon he can get from website or on his phone / app.  If he prefers we can print both for him to pick up paper versions.  Let me know where to send rx or if he needs it printed. Thanks  Saralyn Pilar, DO [facility] 08/19/2020, 12:46 PM

## 2020-09-12 ENCOUNTER — Other Ambulatory Visit: Payer: Self-pay | Admitting: Family Medicine

## 2020-09-12 DIAGNOSIS — I10 Essential (primary) hypertension: Secondary | ICD-10-CM

## 2020-09-12 NOTE — Telephone Encounter (Signed)
Requested medication (s) are due for refill today: yes  Requested medication (s) are on the active medication list: yes  Last refill:  06/11/20  Future visit scheduled: no  Notes to clinic:  overdue lab work   Requested Prescriptions  Pending Prescriptions Disp Refills   lisinopril (ZESTRIL) 20 MG tablet [Pharmacy Med Name: LISINOPRIL 20 MG TABLET] 90 tablet 0    Sig: TAKE 1 TABLET BY MOUTH EVERY DAY     Cardiovascular:  ACE Inhibitors Failed - 09/12/2020  9:47 AM      Failed - Cr in normal range and within 180 days    Creat  Date Value Ref Range Status  09/09/2019 1.02 0.70 - 1.33 mg/dL Final    Comment:    For patients >51 years of age, the reference limit for Creatinine is approximately 13% higher for people identified as African-American. .           Failed - K in normal range and within 180 days    Potassium  Date Value Ref Range Status  09/09/2019 4.4 3.5 - 5.3 mmol/L Final          Passed - Patient is not pregnant      Passed - Last BP in normal range    BP Readings from Last 1 Encounters:  07/21/20 130/85          Passed - Valid encounter within last 6 months    Recent Outpatient Visits           1 month ago Conjunctival abrasion, left, initial encounter   [facility] [place], Netta Neat, DO   3 months ago Primary osteoarthritis of both hands   [facility] [place], Netta Neat, DO   1 year ago Annual physical exam   [facility] Smitty Cords, DO   1 year ago Drug-induced erectile dysfunction   [facility] Smitty Cords, DO   1 year ago Pre-diabetes   [facility] [place], Netta Neat, [place]

## 2020-12-12 ENCOUNTER — Other Ambulatory Visit: Payer: Self-pay | Admitting: Internal Medicine

## 2020-12-12 DIAGNOSIS — I10 Essential (primary) hypertension: Secondary | ICD-10-CM

## 2020-12-13 NOTE — Telephone Encounter (Signed)
Requested medication (s) are due for refill today: yes  Requested medication (s) are on the active medication list: yes  Last refill:  09/14/20   Future visit scheduled: no  Notes to clinic:  overdue lab work   Requested Prescriptions  Pending Prescriptions Disp Refills   lisinopril (ZESTRIL) 20 MG tablet [Pharmacy Med Name: LISINOPRIL 20 MG TABLET] 90 tablet 0    Sig: TAKE 1 TABLET BY MOUTH EVERY DAY     Cardiovascular:  ACE Inhibitors Failed - 12/12/2020  9:14 AM      Failed - Cr in normal range and within 180 days    Creat  Date Value Ref Range Status  09/09/2019 1.02 0.70 - 1.33 mg/dL Final    Comment:    For patients >62 years of age, the reference limit for Creatinine is approximately 13% higher for people identified as African-American. .           Failed - K in normal range and within 180 days    Potassium  Date Value Ref Range Status  09/09/2019 4.4 3.5 - 5.3 mmol/L Final          Passed - Patient is not pregnant      Passed - Last BP in normal range    BP Readings from Last 1 Encounters:  07/21/20 130/85          Passed - Valid encounter within last 6 months    Recent Outpatient Visits           4 months ago Conjunctival abrasion, left, initial encounter   [facility] [place], Netta Neat, DO   6 months ago Primary osteoarthritis of both hands   [facility] [place], Netta Neat, DO   1 year ago Annual physical exam   [facility] Smitty Cords, DO   1 year ago Drug-induced erectile dysfunction   [facility] Smitty Cords, DO   1 year ago Pre-diabetes   [facility] [place], Netta Neat, [place]

## 2021-03-31 ENCOUNTER — Ambulatory Visit: Payer: BC Managed Care – PPO | Admitting: Family Medicine

## 2021-04-01 ENCOUNTER — Other Ambulatory Visit: Payer: Self-pay | Admitting: Family Medicine

## 2021-04-01 ENCOUNTER — Encounter: Payer: Self-pay | Admitting: Family Medicine

## 2021-04-01 ENCOUNTER — Ambulatory Visit: Payer: BC Managed Care – PPO | Admitting: Family Medicine

## 2021-04-01 ENCOUNTER — Other Ambulatory Visit: Payer: BC Managed Care – PPO

## 2021-04-01 ENCOUNTER — Other Ambulatory Visit: Payer: Self-pay

## 2021-04-01 VITALS — BP 132/78 | HR 87 | Ht 72.5 in | Wt 215.0 lb

## 2021-04-01 DIAGNOSIS — G4719 Other hypersomnia: Secondary | ICD-10-CM | POA: Diagnosis not present

## 2021-04-01 DIAGNOSIS — M79675 Pain in left toe(s): Secondary | ICD-10-CM | POA: Diagnosis not present

## 2021-04-01 DIAGNOSIS — R29818 Other symptoms and signs involving the nervous system: Secondary | ICD-10-CM | POA: Diagnosis not present

## 2021-04-01 DIAGNOSIS — I10 Essential (primary) hypertension: Secondary | ICD-10-CM | POA: Diagnosis not present

## 2021-04-01 NOTE — Patient Instructions (Addendum)
Thank you for coming to the office today. ? ?For the Little toe, no x-ray today. Recommend buddy tape with fabric medical tape to support it, and keep wearing hard sole sturdy shoes to support, may take 4-6 weeks for full healing. ? ?[facility] ?Address: [address] [place], [place], [place] [address] ?Phone: [phone] ? ?Referral sent via fax today. Stay tuned to hear back from them, they should call you to schedule initial sleep study, likely a home test and then if abnormal - they will arrange the 2nd part of the sleep study in the lab with the CPAP machine. ? ?If there is any issue with this company, for example not covered by insurance or other problem, please notify us and they may do so a well - we will need to change the location of the referral. ? ? ?DUE for FASTING BLOOD WORK (no food or drink after midnight before the lab appointment, only water or coffee without cream/sugar on the morning of) ? ?SCHEDULE "Lab Only" visit in the morning at the clinic for lab draw in 2 MONTHS  ? ?- Make sure Lab Only appointment is at about 1 week before your next appointment, so that results will be available ? ?For Lab Results, once available within 2-3 days of blood draw, you can can log in to MyChart online to view your results and a brief explanation. Also, we can discuss results at next follow-up visit. ? ? ?Please schedule a Follow-up Appointment to: Return in about 2 months (around 06/01/2021) for 2 month fasting labs then 1 week later Annual Physical. ? ?If you have any other questions or concerns, please feel free to call the office or send a message through MyChart. You may also schedule an earlier appointment if necessary. ? ?Additionally, you may be receiving a survey about your experience at our office within a few days to 1 week by e-mail or mail. We value your feedback. ? ?Saralyn Pilar, DO ?[facility], [place] ?

## 2021-04-01 NOTE — Progress Notes (Signed)
? ?Subjective:  ? ? Patient ID: Kyle Buchanan, male    DOB: 1960-07-23, 61 y.o.   MRN: [id number] ? ?Kyle Buchanan is a 61 y.o. male presenting on 04/01/2021 for Sleeping Problem ? ? ?HPI ? ?Suspected Sleep Apnea ?Daytime sleepiness ?Reports family concerned with his sleep. ? ?Epworth Sleepiness Scale ?Total Score: 16 ?Sitting and reading - 3 ?Watching TV - 3 ?Sitting inactive in a public place - 1 ?As a passenger in a car for an hour without a break - 3 ?Lying down to rest in the afternoon when circumstances permit - 3 ?Sitting and talking to someone - 0 ?Sitting quietly after a lunch without alcohol - 3 ?In a car, while stopped for a few minutes in traffic - 0 ? ?STOP-Bang OSA scoring ?Snoring yes   ?Tiredness yes   ?Observed apneas no   ?Pressure HTN yes   ?BMI > 35 kg/m2 no   ?Age > 50  yes   ?Neck (male >17 in; Male >16 in)  yes >17"  ?Gender male yes   ?OSA risk low (0-2)  ?OSA risk intermediate (3-4)  ?OSA risk high (5+)  Total: 6 high risk  ? ? ?Left Toe pain, 5th little toe ?He had "stubbed toe" injury 1.5 weeks ago it is improving but still pain sometimes if walking without shoe. ?He uses steel toed shoes ? ? ?Depression screen [facility] 2/9 07/21/2020 09/13/2019 03/15/2019  ?Decreased Interest 2 0 0  ?Down, Depressed, Hopeless 0 0 0  ?PHQ - 2 Score 2 0 0  ?Altered sleeping 0 - -  ?Tired, decreased energy 0 - -  ?Change in appetite 0 - -  ?Feeling bad or failure about yourself  0 - -  ?Trouble concentrating 0 - -  ?Moving slowly or fidgety/restless 0 - -  ?Suicidal thoughts 0 - -  ?PHQ-9 Score 2 - -  ?Difficult doing work/chores Not difficult at all - -  ? ? ?Social History  ? ?Tobacco Use  ? Smoking status: Former  ?  Packs/day: 1.00  ?  Years: 9.00  ?  Pack years: 9.00  ?  Types: Cigarettes  ? Smokeless tobacco: Former  ?Vaping Use  ? Vaping Use: Never used  ?Substance Use Topics  ? Alcohol use: Not Currently  ?  Comment: past  ? Drug use: Not Currently  ?  Types: Marijuana, Cocaine  ?  Comment:  past   ? ? ?Review of Systems ?Per HPI unless specifically indicated above ? ?   ?Objective:  ?  ?BP 132/78   Pulse 87   Ht 6' 0.5" (1.842 m)   Wt 215 lb (97.5 kg)   SpO2 100%   BMI 28.76 kg/m?   ?Wt Readings from Last 3 Encounters:  ?04/01/21 215 lb (97.5 kg)  ?07/21/20 202 lb 12.8 oz (92 kg)  ?05/18/20 203 lb 3.2 oz (92.2 kg)  ?  ?Physical Exam ?Vitals and nursing note reviewed.  ?Constitutional:   ?   General: He is not in acute distress. ?   Appearance: Normal appearance. He is well-developed. He is not diaphoretic.  ?   Comments: Well-appearing, comfortable, cooperative  ?HENT:  ?   Head: Normocephalic and atraumatic.  ?Eyes:  ?   General:     ?   Right eye: No discharge.     ?   Left eye: No discharge.  ?   Conjunctiva/sclera: Conjunctivae normal.  ?Cardiovascular:  ?   Rate and Rhythm: Normal rate.  ?Pulmonary:  ?  Effort: Pulmonary effort is normal.  ?Musculoskeletal:  ?   Comments: Left foot normal appearance 5th toe seems normal without ecchymosis, he can move it without problem, no swelling, non tender.  ?Skin: ?   General: Skin is warm and dry.  ?   Findings: No erythema or rash.  ?Neurological:  ?   Mental Status: He is alert and oriented to person, place, and time.  ?Psychiatric:     ?   Mood and Affect: Mood normal.     ?   Behavior: Behavior normal.     ?   Thought Content: Thought content normal.  ?   Comments: Well groomed, good eye contact, normal speech and thoughts  ? ? ? ?Results for orders placed or performed in visit on 09/09/19  ?Hemoglobin A1c  ?Result Value Ref Range  ? Hgb A1c MFr Bld 6.2 (H) <5.7 % of total Hgb  ? Mean Plasma Glucose 131 (calc)  ? eAG (mmol/L) 7.3 (calc)  ?PSA  ?Result Value Ref Range  ? PSA 0.3 < OR = 4.0 ng/mL  ?Lipid panel  ?Result Value Ref Range  ? Cholesterol 167 <200 mg/dL  ? HDL 72 > OR = 40 mg/dL  ? Triglycerides 62 <150 mg/dL  ? LDL Cholesterol (Calc) 81 mg/dL (calc)  ? Total CHOL/HDL Ratio 2.3 <5.0 (calc)  ? Non-HDL Cholesterol (Calc) 95 <161 mg/dL  (calc)  ?COMPLETE METABOLIC PANEL WITH GFR  ?Result Value Ref Range  ? Glucose, Bld 63 (L) 65 - 99 mg/dL  ? BUN 21 7 - 25 mg/dL  ? Creat 1.02 0.70 - 1.33 mg/dL  ? GFR, Est Non African American 80 > OR = 60 mL/min/1.23m2  ? GFR, Est African American 93 > OR = 60 mL/min/1.71m2  ? BUN/Creatinine Ratio NOT APPLICABLE 6 - 22 (calc)  ? Sodium 137 135 - 146 mmol/L  ? Potassium 4.4 3.5 - 5.3 mmol/L  ? Chloride 100 98 - 110 mmol/L  ? CO2 24 20 - 32 mmol/L  ? Calcium 9.9 8.6 - 10.3 mg/dL  ? Total Protein 7.4 6.1 - 8.1 g/dL  ? Albumin 4.4 3.6 - 5.1 g/dL  ? Globulin 3.0 1.9 - 3.7 g/dL (calc)  ? AG Ratio 1.5 1.0 - 2.5 (calc)  ? Total Bilirubin 0.6 0.2 - 1.2 mg/dL  ? Alkaline phosphatase (APISO) 49 35 - 144 U/L  ? AST 31 10 - 35 U/L  ? ALT 23 9 - 46 U/L  ?CBC with Differential/Platelet  ?Result Value Ref Range  ? WBC 2.7 (L) 3.8 - 10.8 Thousand/uL  ? RBC 5.35 4.20 - 5.80 Million/uL  ? Hemoglobin 14.6 13.2 - 17.1 g/dL  ? HCT 45.2 38.5 - 50.0 %  ? MCV 84.5 80.0 - 100.0 fL  ? MCH 27.3 27.0 - 33.0 pg  ? MCHC 32.3 32.0 - 36.0 g/dL  ? RDW 13.6 11.0 - 15.0 %  ? Platelets 173 140 - 400 Thousand/uL  ? MPV 9.1 7.5 - 12.5 fL  ? Neutro Abs 888 (L) 1,500 - 7,800 cells/uL  ? Lymphs Abs 1,245 850 - 3,900 cells/uL  ? Absolute Monocytes 348 200 - 950 cells/uL  ? Eosinophils Absolute 189 15 - 500 cells/uL  ? Basophils Absolute 30 0 - 200 cells/uL  ? Neutrophils Relative % 32.9 %  ? Total Lymphocyte 46.1 %  ? Monocytes Relative 12.9 %  ? Eosinophils Relative 7.0 %  ? Basophils Relative 1.1 %  ? ?   ?Assessment & Plan:  ? ?Problem List  Items Addressed This Visit   ? ? Essential hypertension  ? ?Other Visit Diagnoses   ? ? Suspected sleep apnea    -  Primary  ? Excessive daytime sleepiness      ? Toe pain, left      ? ?  ?  ?New problem with clinical concern for suspected obstructive sleep apnea given reported symptoms with snoring and sleep disturbance, fatigue excessive sleepiness. ?- Screening: ESS score 16 / STOP-Bang Score 6 ?- Neck  Circumference: >17" ?- Co-morbidities: HTN ? ?Plan: ?1. Discussion on initial diagnosis and testing for OSA, risk factors, management, complications ?2. Agree to proceed with sleep study testing based on clinical concerns - will [facility] to [facility] - to arrange initial PSG home vs in sleep lab and further evaluation. ? ? ?Toe appears normal, likely contusion. Less likely fracture. Use supportive firm bottom shoes ?Follow up if worse ? ?No orders of the defined types were placed in this encounter. ? ? ? ?Follow up plan: ?Return in about 2 months (around 06/01/2021) for 2 month fasting labs then 1 week later Annual Physical. ? ?Future labs ordered for 05/2021 ? ? ?Saralyn PilarAlexander Ailed Defibaugh, DO ?[facility] ?[facility] ?04/01/2021, 10:21 AM ?

## 2021-04-19 ENCOUNTER — Other Ambulatory Visit: Payer: BC Managed Care – PPO

## 2021-04-19 DIAGNOSIS — R351 Nocturia: Secondary | ICD-10-CM

## 2021-04-19 DIAGNOSIS — E663 Overweight: Secondary | ICD-10-CM

## 2021-04-19 DIAGNOSIS — Z0000 Encounter for general adult medical examination without abnormal findings: Secondary | ICD-10-CM

## 2021-04-19 DIAGNOSIS — R7303 Prediabetes: Secondary | ICD-10-CM

## 2021-04-19 DIAGNOSIS — I10 Essential (primary) hypertension: Secondary | ICD-10-CM

## 2021-04-20 LAB — CBC WITH DIFFERENTIAL/PLATELET
Absolute Monocytes: 383 cells/uL (ref 200–950)
Basophils Absolute: 41 cells/uL (ref 0–200)
Basophils Relative: 1.5 %
Eosinophils Absolute: 243 cells/uL (ref 15–500)
Eosinophils Relative: 9 %
HCT: 45 % (ref 38.5–50.0)
Hemoglobin: 14.6 g/dL (ref 13.2–17.1)
Lymphs Abs: 956 cells/uL (ref 850–3900)
MCH: 27.1 pg (ref 27.0–33.0)
MCHC: 32.4 g/dL (ref 32.0–36.0)
MCV: 83.5 fL (ref 80.0–100.0)
MPV: 8.8 fL (ref 7.5–12.5)
Monocytes Relative: 14.2 %
Neutro Abs: 1077 cells/uL — ABNORMAL LOW (ref 1500–7800)
Neutrophils Relative %: 39.9 %
Platelets: 227 10*3/uL (ref 140–400)
RBC: 5.39 10*6/uL (ref 4.20–5.80)
RDW: 13.1 % (ref 11.0–15.0)
Total Lymphocyte: 35.4 %
WBC: 2.7 10*3/uL — ABNORMAL LOW (ref 3.8–10.8)

## 2021-04-20 LAB — COMPLETE METABOLIC PANEL WITH GFR
AG Ratio: 1.5 (calc) (ref 1.0–2.5)
ALT: 24 U/L (ref 9–46)
AST: 26 U/L (ref 10–35)
Albumin: 4.1 g/dL (ref 3.6–5.1)
Alkaline phosphatase (APISO): 48 U/L (ref 35–144)
BUN: 23 mg/dL (ref 7–25)
CO2: 25 mmol/L (ref 20–32)
Calcium: 9.5 mg/dL (ref 8.6–10.3)
Chloride: 106 mmol/L (ref 98–110)
Creat: 0.99 mg/dL (ref 0.70–1.35)
Globulin: 2.7 g/dL (calc) (ref 1.9–3.7)
Glucose, Bld: 107 mg/dL (ref 65–139)
Potassium: 4.6 mmol/L (ref 3.5–5.3)
Sodium: 140 mmol/L (ref 135–146)
Total Bilirubin: 0.5 mg/dL (ref 0.2–1.2)
Total Protein: 6.8 g/dL (ref 6.1–8.1)
eGFR: 87 mL/min/{1.73_m2} (ref >=60)

## 2021-04-20 LAB — LIPID PANEL
Cholesterol: 147 mg/dL (ref <200)
HDL: 65 mg/dL (ref >=40)
LDL Cholesterol (Calc): 68 mg/dL (calc)
Non-HDL Cholesterol (Calc): 82 mg/dL (calc) (ref <130)
Total CHOL/HDL Ratio: 2.3 (calc) (ref <5.0)
Triglycerides: 59 mg/dL (ref <150)

## 2021-04-20 LAB — PSA: PSA: 0.52 ng/mL (ref <=4.00)

## 2021-04-20 LAB — HEMOGLOBIN A1C
Hgb A1c MFr Bld: 6.2 % of total Hgb — ABNORMAL HIGH (ref <5.7)
Mean Plasma Glucose: 131 mg/dL
eAG (mmol/L): 7.3 mmol/L

## 2021-04-20 LAB — TSH: TSH: 0.66 mIU/L (ref 0.40–4.50)

## 2021-04-21 ENCOUNTER — Ambulatory Visit (INDEPENDENT_AMBULATORY_CARE_PROVIDER_SITE_OTHER): Payer: BC Managed Care – PPO | Admitting: Family Medicine

## 2021-04-21 ENCOUNTER — Other Ambulatory Visit: Payer: Self-pay | Admitting: Family Medicine

## 2021-04-21 ENCOUNTER — Encounter: Payer: Self-pay | Admitting: Family Medicine

## 2021-04-21 VITALS — BP 124/78 | HR 81 | Ht 72.5 in | Wt 213.8 lb

## 2021-04-21 DIAGNOSIS — N522 Drug-induced erectile dysfunction: Secondary | ICD-10-CM | POA: Diagnosis not present

## 2021-04-21 DIAGNOSIS — I10 Essential (primary) hypertension: Secondary | ICD-10-CM | POA: Diagnosis not present

## 2021-04-21 DIAGNOSIS — R7303 Prediabetes: Secondary | ICD-10-CM | POA: Diagnosis not present

## 2021-04-21 DIAGNOSIS — E663 Overweight: Secondary | ICD-10-CM

## 2021-04-21 DIAGNOSIS — Z0000 Encounter for general adult medical examination without abnormal findings: Secondary | ICD-10-CM

## 2021-04-21 DIAGNOSIS — R351 Nocturia: Secondary | ICD-10-CM

## 2021-04-21 MED ORDER — SILDENAFIL CITRATE 20 MG PO TABS: ORAL_TABLET | ORAL | 3 refills | Status: DC

## 2021-04-21 NOTE — Patient Instructions (Addendum)
Thank you for coming to the office today. ? ?[facility] ?Address: [address] [place], [place], [place] [address] ?Phone: [phone] ? ?Referral was faxed on 04/01/21 - check with them to see if they can schedule you, or if you need any other help. ? ?Take Sildenafil rx to [facility]. ? ?DUE for FASTING BLOOD WORK (no food or drink after midnight before the lab appointment, only water or coffee without cream/sugar on the morning of) ? ?SCHEDULE "Lab Only" visit in the morning at the clinic for lab draw in  1 YEAR ? ?- Make sure Lab Only appointment is at about 1 week before your next appointment, so that results will be available ? ?For Lab Results, once available within 2-3 days of blood draw, you can can log in to MyChart online to view your results and a brief explanation. Also, we can discuss results at next follow-up visit. ? ? ?Please schedule a Follow-up Appointment to: Return in about 1 year (around 04/22/2022) for 1 year fasting lab only then 1 week later Annual Physical. ? ?If you have any other questions or concerns, please feel free to call the office or send a message through MyChart. You may also schedule an earlier appointment if necessary. ? ?Additionally, you may be receiving a survey about your experience at our office within a few days to 1 week by e-mail or mail. We value your feedback. ? ?Saralyn Pilar, DO ?[facility], [place] ?

## 2021-04-21 NOTE — Assessment & Plan Note (Signed)
Controlled BP, low normal ?Home BP normal ?No known complications ?OFF Amlodipine 5-10mg  due to pedal edema ?  ? ?Plan:  ?1. Continue Lisinopril 20mg  daily ?2. Encourage improved lifestyle - low sodium diet, regular exercise ?3. Continue monitor BP outside office, bring readings to next visit, if persistently >140/90 or new symptoms notify office sooner ?

## 2021-04-21 NOTE — Assessment & Plan Note (Signed)
Stable A1c to 6.2, improved diet ? ?Plan:  ?1. Not on any therapy currently  ?2. Encourage improved lifestyle - low carb, low sugar diet, reduce portion size, continue improving regular exercise ?

## 2021-04-21 NOTE — Progress Notes (Signed)
? ?Subjective:  ? ? Patient ID: Kyle Buchanan, male    DOB: February 15, 1960, 61 y.o.   MRN: [id number] ? ?Kyle Buchanan is a 61 y.o. male presenting on 04/21/2021 for Annual Exam ? ? ?HPI ? ?Here for Annual Physical and Lab Review ?  ?Pre-Diabetes / Overweight BMI >28 ?Prior A1c up to 6.4, now recent lab improved 6.2 - now maintained at 6.2 ?Improving lifestyle diet ?CBGs: Not checking CBG ?Meds: Never on med ?Currently not on ACEi / ARB ?Lifestyle: ?- weight gain 12-15 lbs in past year, but has lost 2 lbs in past 1 month ?- Diet (Improved diet no bread now, healthy options - reduced sweets) ?- Exercise (He does walking and exercising back on bike) ?Denies hypoglycemia, polyuria, visual changes, numbness or tingling. ?  ?CHRONIC HTN ?See above lifestyle ?Today he is doing well, BP is controlled ?Taking Lisinopril 20mg  daily ?Reports good compliance, took meds today. Tolerating well, w/o complaints. ?Denies CP, dyspnea, HA, edema, dizziness / lightheadedness ?  ?HYPERLIPIDEMIA: ?- Reports no concerns. Last lipid panel 04/2021, controlled on lifestyle ?Controlled on diet and lifestyle ? ?Erectile Dysfunction ?Improved, had sildenafil but now rarely using it. He is doing much better. ?Re order and GoodRx ?  ?Additionally - followed by Neurology 05/2021) Dr Gavin Potters - Admits irritable on Lyrica, off Gabapentin ?  ?Health Maintenance: ?  ?Prostate CA Screening: Last PSA 0.52 (04/2021). Currently asymptomatic BPH LUTS. No known family history of prostate CA.  ? ? ?Colonoscopy last done 04/10/19, next due 7 years 2028 ? ? ?  07/21/2020  ? 11:17 AM 09/13/2019  ?  9:10 AM 03/15/2019  ?  8:03 AM  ?Depression screen PHQ 2/9  ?Decreased Interest 2 0 0  ?Down, Depressed, Hopeless 0 0 0  ?PHQ - 2 Score 2 0 0  ?Altered sleeping 0    ?Tired, decreased energy 0    ?Change in appetite 0    ?Feeling bad or failure about yourself  0    ?Trouble concentrating 0    ?Moving slowly or fidgety/restless 0    ?Suicidal thoughts 0    ?PHQ-9  Score 2    ?Difficult doing work/chores Not difficult at all    ? ? ?Past Medical History:  ?Diagnosis Date  ? Hepatitis C   ? ?Past Surgical History:  ?Procedure Laterality Date  ? COLONOSCOPY WITH PROPOFOL N/A 04/10/2019  ? Procedure: COLONOSCOPY WITH PROPOFOL;  Surgeon: 04/12/2019, MD;  [facility]COPY;  Service: Gastroenterology;  Laterality: N/A;  PRIORITY 4  ? ?Social History  ? ?Socioeconomic History  ? Marital status: Married  ?  Spouse name: Not on file  ? Number of children: Not on file  ? Years of education: Not on file  ? Highest education level: Not on file  ?Occupational History  ? Not on file  ?Tobacco Use  ? Smoking status: Former  ?  Packs/day: 1.00  ?  Years: 9.00  ?  Pack years: 9.00  ?  Types: Cigarettes  ? Smokeless tobacco: Former  ?Vaping Use  ? Vaping Use: Never used  ?Substance and Sexual Activity  ? Alcohol use: Not Currently  ?  Comment: past  ? Drug use: Not Currently  ?  Types: Marijuana, Cocaine  ?  Comment: past   ? Sexual activity: Not on file  ?Other Topics Concern  ? Not on file  ?Social History Narrative  ? Not on file  ? ?Social Determinants of Health  ? ?Financial  Resource Strain: Not on file  ?Food Insecurity: Not on file  ?Transportation Needs: Not on file  ?Physical Activity: Not on file  ?Stress: Not on file  ?Social Connections: Not on file  ?Intimate Partner Violence: Not on file  ? ?Family History  ?Problem Relation Age of Onset  ? Cancer Father   ?     bone cancer  ? Cancer Sister   ?     uncertain type  ? ?Current Outpatient Medications on File Prior to Visit  ?Medication Sig  ? diclofenac Sodium (VOLTAREN) 1 % GEL Apply 2 g topically 4 (four) times daily as needed (fingers, hand arthritis).  ? lisinopril (ZESTRIL) 20 MG tablet TAKE 1 TABLET BY MOUTH EVERY DAY  ? Multiple Vitamin (MULTI-VITAMINS) TABS Take by mouth.  ? nortriptyline (PAMELOR) 10 MG capsule TAKE 1 CAPSULE BY MOUTH EVERY DAY AT NIGHT  ? ?No current facility-administered medications on file  prior to visit.  ? ? ?Review of Systems  ?Constitutional:  Negative for activity change, appetite change, chills, diaphoresis, fatigue and fever.  ?HENT:  Negative for congestion and hearing loss.   ?Eyes:  Negative for visual disturbance.  ?Respiratory:  Negative for cough, chest tightness, shortness of breath and wheezing.   ?Cardiovascular:  Negative for chest pain, palpitations and leg swelling.  ?Gastrointestinal:  Negative for abdominal pain, constipation, diarrhea, nausea and vomiting.  ?Genitourinary:  Negative for dysuria, frequency and hematuria.  ?Musculoskeletal:  Negative for arthralgias and neck pain.  ?Skin:  Negative for rash.  ?Neurological:  Negative for dizziness, weakness, light-headedness, numbness and headaches.  ?Hematological:  Negative for adenopathy.  ?Psychiatric/Behavioral:  Negative for behavioral problems, dysphoric mood and sleep disturbance.   ?Per HPI unless specifically indicated above ? ? ?   ?Objective:  ?  ?BP 124/78   Pulse 81   Ht 6' 0.5" (1.842 m)   Wt 213 lb 12.8 oz (97 kg)   SpO2 99%   BMI 28.60 kg/m?   ?Wt Readings from Last 3 Encounters:  ?04/21/21 213 lb 12.8 oz (97 kg)  ?04/01/21 215 lb (97.5 kg)  ?07/21/20 202 lb 12.8 oz (92 kg)  ?  ?Physical Exam ?Vitals and nursing note reviewed.  ?Constitutional:   ?   General: He is not in acute distress. ?   Appearance: He is well-developed. He is not diaphoretic.  ?   Comments: Well-appearing, comfortable, cooperative  ?HENT:  ?   Head: Normocephalic and atraumatic.  ?Eyes:  ?   General:     ?   Right eye: No discharge.     ?   Left eye: No discharge.  ?   Conjunctiva/sclera: Conjunctivae normal.  ?   Pupils: Pupils are equal, round, and reactive to light.  ?Neck:  ?   Thyroid: No thyromegaly.  ?   Vascular: No carotid bruit.  ?Cardiovascular:  ?   Rate and Rhythm: Normal rate and regular rhythm.  ?   Pulses: Normal pulses.  ?   Heart sounds: Normal heart sounds. No murmur heard. ?Pulmonary:  ?   Effort: Pulmonary effort is  normal. No respiratory distress.  ?   Breath sounds: Normal breath sounds. No wheezing or rales.  ?Abdominal:  ?   General: Bowel sounds are normal. There is no distension.  ?   Palpations: Abdomen is soft. There is no mass.  ?   Tenderness: There is no abdominal tenderness.  ?Musculoskeletal:     ?   General: No tenderness. Normal range of motion.  ?  Cervical back: Normal range of motion and neck supple.  ?   Right lower leg: No edema.  ?   Left lower leg: No edema.  ?   Comments: Upper / Lower Extremities: ?- Normal muscle tone, strength bilateral upper extremities 5/5, lower extremities 5/5  ?Lymphadenopathy:  ?   Cervical: No cervical adenopathy.  ?Skin: ?   General: Skin is warm and dry.  ?   Findings: No erythema or rash.  ?Neurological:  ?   Mental Status: He is alert and oriented to person, place, and time.  ?   Comments: Distal sensation intact to light touch all extremities  ?Psychiatric:     ?   Mood and Affect: Mood normal.     ?   Behavior: Behavior normal.     ?   Thought Content: Thought content normal.  ?   Comments: Well groomed, good eye contact, normal speech and thoughts  ? ? ? ?Results for orders placed or performed in visit on 04/19/21  ?TSH  ?Result Value Ref Range  ? TSH 0.66 0.40 - 4.50 mIU/L  ?PSA  ?Result Value Ref Range  ? PSA 0.52 < OR = 4.00 ng/mL  ?Hemoglobin A1c  ?Result Value Ref Range  ? Hgb A1c MFr Bld 6.2 (H) <5.7 % of total Hgb  ? Mean Plasma Glucose 131 mg/dL  ? eAG (mmol/L) 7.3 mmol/L  ?Lipid panel  ?Result Value Ref Range  ? Cholesterol 147 <200 mg/dL  ? HDL 65 > OR = 40 mg/dL  ? Triglycerides 59 <150 mg/dL  ? LDL Cholesterol (Calc) 68 mg/dL (calc)  ? Total CHOL/HDL Ratio 2.3 <5.0 (calc)  ? Non-HDL Cholesterol (Calc) 82 <161<130 mg/dL (calc)  ?CBC with Differential/Platelet  ?Result Value Ref Range  ? WBC 2.7 (L) 3.8 - 10.8 Thousand/uL  ? RBC 5.39 4.20 - 5.80 Million/uL  ? Hemoglobin 14.6 13.2 - 17.1 g/dL  ? HCT 45.0 38.5 - 50.0 %  ? MCV 83.5 80.0 - 100.0 fL  ? MCH 27.1 27.0 -  33.0 pg  ? MCHC 32.4 32.0 - 36.0 g/dL  ? RDW 13.1 11.0 - 15.0 %  ? Platelets 227 140 - 400 Thousand/uL  ? MPV 8.8 7.5 - 12.5 fL  ? Neutro Abs 1,077 (L) 1,500 - 7,800 cells/uL  ? Lymphs Abs 956 850 - 3,900

## 2021-05-14 ENCOUNTER — Ambulatory Visit: Payer: Self-pay

## 2021-05-14 DIAGNOSIS — M25521 Pain in right elbow: Secondary | ICD-10-CM

## 2021-05-14 NOTE — Telephone Encounter (Signed)
?  Chief Complaint: Arm pain after overuse. ?Symptoms: pain ?Frequency: 2 days ago ?Pertinent Negatives: Patient denies Chest pain. ?Disposition: [] ED /[] Urgent Care (no appt availability in office) / [] Appointment(In office/virtual)/ []  [facility]/ [] Home Care/ [x] Refused Recommended Disposition /[] [facility]/ []  Follow-up with PCP ?Additional Notes: Pt uses his arms at work and for the past 2 days has experienced bi-lateral arm pain. Pain (1/10)is on the backside of arms around the elbow area.  Pt refuses disposition and would like a call for provider. Pt would like a combination of pain medication/muscle relaxer and possibly anti inflammatory. Please return pt's call. ? ? ? ?Summary: pain inside the arms when bending (both)  ? Pt is requesting a muscle relaxer for pain inside the arms when bending (both) and stated that he feels a little pain when he tries to do something. Pt mentioned that maybe he has done too much lifting at work.  Pt started to feel discomfort about two days ago.  ? ?Pt declined appointment.  ? ?Seeking clinical advice.  ?  ? ?Reason for Disposition ? Arm pain ? ?Answer Assessment - Initial Assessment Questions ?1. ONSET: "When did the pain start?" ?    2 days ago ?2. LOCATION: "Where is the pain located?" ?    Elbow area on the back of the arm. ?3. PAIN: "How bad is the pain?" (Scale 1-10; or mild, moderate, severe) ?  - MILD (1-3): doesn't interfere with normal activities ?  - MODERATE (4-7): interferes with normal activities (e.g., work or school) or awakens from sleep ?  - SEVERE (8-10): excruciating pain, unable to do any normal activities, unable to hold a cup of water ?    1/10 ?4. WORK OR EXERCISE: "Has there been any recent work or exercise that involved this part of the body?" ?    yes ?5. CAUSE: "What do you think is causing the arm pain?" ?    Over work ?6. OTHER SYMPTOMS: "Do you have any other symptoms?" (e.g., neck pain, swelling, rash, fever, numbness,  weakness) ?    no ?7. PREGNANCY: "Is there any chance you are pregnant?" "When was your last menstrual period?" ?    na ? ?Protocols used: Arm Pain-A-AH ? ?

## 2021-05-17 MED ORDER — BACLOFEN 10 MG PO TABS: 5.0000 mg | ORAL_TABLET | Freq: Three times a day (TID) | ORAL | 1 refills | Status: DC | PRN

## 2021-05-17 NOTE — Addendum Note (Signed)
Addended by: Smitty Cords on: 05/17/2021 01:05 PM ? ? Modules accepted: Orders ? ?

## 2021-05-17 NOTE — Telephone Encounter (Signed)
Okay I will order Baclofen muscle relaxant trial. He can follow up if not improved. He was just seen recently can send med in now. ? ?Saralyn Pilar, DO ?[facility] ?[facility] ?05/17/2021, 1:04 PM ? ?

## 2021-05-28 ENCOUNTER — Encounter: Payer: Self-pay | Admitting: Family Medicine

## 2021-05-28 DIAGNOSIS — G4733 Obstructive sleep apnea (adult) (pediatric): Secondary | ICD-10-CM | POA: Insufficient documentation

## 2021-06-17 ENCOUNTER — Other Ambulatory Visit: Payer: Self-pay | Admitting: Family Medicine

## 2021-06-17 DIAGNOSIS — I10 Essential (primary) hypertension: Secondary | ICD-10-CM

## 2021-06-17 NOTE — Telephone Encounter (Signed)
Requested Prescriptions  Pending Prescriptions Disp Refills  . lisinopril (ZESTRIL) 20 MG tablet [Pharmacy Med Name: LISINOPRIL 20 MG TABLET] 90 tablet 0    Sig: TAKE 1 TABLET BY MOUTH EVERY DAY     Cardiovascular:  ACE Inhibitors Passed - 06/17/2021  2:14 AM      Passed - Cr in normal range and within 180 days    Creat  Date Value Ref Range Status  04/19/2021 0.99 0.70 - 1.35 mg/dL Final         Passed - K in normal range and within 180 days    Potassium  Date Value Ref Range Status  04/19/2021 4.6 3.5 - 5.3 mmol/L Final         Passed - Patient is not pregnant      Passed - Last BP in normal range    BP Readings from Last 1 Encounters:  04/21/21 124/78         Passed - Valid encounter within last 6 months    Recent Outpatient Visits          1 month ago Annual physical exam   [facility], Netta Neat, DO   2 months ago Suspected sleep apnea   [facility] Smitty Cords, DO   11 months ago Conjunctival abrasion, left, initial encounter   [facility] [place], Netta Neat, DO   1 year ago Primary osteoarthritis of both hands   [facility] [place], Netta Neat, DO   1 year ago Annual physical exam   [facility] Smitty Cords, DO      Future Appointments            In 10 months Althea Charon, Netta Neat, DO [facility]

## 2021-07-19 ENCOUNTER — Ambulatory Visit (INDEPENDENT_AMBULATORY_CARE_PROVIDER_SITE_OTHER): Payer: BC Managed Care – PPO | Admitting: Internal Medicine

## 2021-07-19 VITALS — BP 116/77 | HR 84 | Resp 16 | Ht 72.0 in | Wt 218.4 lb

## 2021-07-19 DIAGNOSIS — Z7189 Other specified counseling: Secondary | ICD-10-CM | POA: Insufficient documentation

## 2021-07-19 DIAGNOSIS — G4733 Obstructive sleep apnea (adult) (pediatric): Secondary | ICD-10-CM

## 2021-07-19 DIAGNOSIS — I10 Essential (primary) hypertension: Secondary | ICD-10-CM

## 2021-07-19 NOTE — Patient Instructions (Signed)

## 2021-07-19 NOTE — Progress Notes (Signed)
Sleep Medicine   Office Visit  Patient Name: Kyle Buchanan DOB: 1960/02/13 MRN [id number]    Chief Complaint: evaluation/sleep study results   Brief History:  Kyle Buchanan presents for an initial consult for APAP@ 8-16 cmH2O and to discuss sleep study results. Patient has a 3 month history of sleep apnea.   Patient sleep quality is fair due to some daytime fatigue and sleepiness. This is noted most nights. The patient's bed partner reports loud snoring and some gasping in his sleep at night. The patient relates the following symptoms: snoring, daytime sleepiness, some brain fog, headaches are also present. The patient goes to sleep at 4:30pm and wakes up at 8:30pm.  Patient works two jobs and reports some napping 70min-1hrs during breaks at his job.  Sleep quality is better when outside home environment.  Patient has noted no restlessness of his legs at night.  The patient  relates no unusual behavior during the night that would disrupt his sleep at night.  The patient denies history of psychiatric problems. The Epworth Sleepiness Score is 8 out of 24 .  The patient relates  Cardiovascular risk factors include: hypertension.     ROS  General: (-) fever, (-) chills, (-) night sweat Nose and Sinuses: (-) nasal stuffiness or itchiness, (-) postnasal drip, (-) nosebleeds, (-) sinus trouble. Mouth and Throat: (-) sore throat, (-) hoarseness. Neck: (-) swollen glands, (-) enlarged thyroid, (-) neck pain. Respiratory: - cough, - shortness of breath, - wheezing. Neurologic: - numbness, - tingling. Psychiatric: - anxiety, - depression Sleep behavior: -sleep paralysis -hypnogogic hallucinations -dream enactment      -vivid dreams -cataplexy -night terrors -sleep walking   Current Medication: Outpatient Encounter Medications as of 07/19/2021  Medication Sig   baclofen (LIORESAL) 10 MG tablet Take 0.5-1 tablets (5-10 mg total) by mouth 3 (three) times daily as needed for muscle spasms.    diclofenac Sodium (VOLTAREN) 1 % GEL Apply 2 g topically 4 (four) times daily as needed (fingers, hand arthritis).   lisinopril (ZESTRIL) 20 MG tablet TAKE 1 TABLET BY MOUTH EVERY DAY   Multiple Vitamin (MULTI-VITAMINS) TABS Take by mouth.   nortriptyline (PAMELOR) 10 MG capsule TAKE 1 CAPSULE BY MOUTH EVERY DAY AT NIGHT   sildenafil (REVATIO) 20 MG tablet TAKE 1-5 TABS ABOUT 30 MIN PRIOR TO SEX. START WITH 1 AND INCREASE AS NEEDED.   No facility-administered encounter medications on file as of 07/19/2021.    Surgical History: Past Surgical History:  Procedure Laterality Date   COLONOSCOPY WITH PROPOFOL N/A 04/10/2019   Procedure: COLONOSCOPY WITH PROPOFOL;  Surgeon: Toney Reil, MD;  Location[facility] ENDOSCOPY;  Service: Gastroenterology;  Laterality: N/A;  PRIORITY 4    Medical History: Past Medical History:  Diagnosis Date   Hepatitis C     Family History: Non contributory to the present illness  Social History: Social History   Socioeconomic History   Marital status: Married    Spouse name: Not on file   Number of children: Not on file   Years of education: Not on file   Highest education level: Not on file  Occupational History   Not on file  Tobacco Use   Smoking status: Former    Packs/day: 1.00    Years: 9.00    Total pack years: 9.00    Types: Cigarettes   Smokeless tobacco: Former  Building services engineer Use: Never used  Substance and Sexual Activity   Alcohol use: Not Currently  Comment: past   Drug use: Not Currently    Types: Marijuana, Cocaine    Comment: past    Sexual activity: Not on file  Other Topics Concern   Not on file  Social History Narrative   Not on file   Social Determinants of Health   Financial Resource Strain: Not on file  Food Insecurity: Not on file  Transportation Needs: Not on file  Physical Activity: Not on file  Stress: Not on file  Social Connections: Not on file  Intimate Partner Violence: Not on file    Vital  Signs: Blood pressure 116/77, pulse 84, resp. rate 16, height 6' (1.829 m), weight 218 lb 6.4 oz (99.1 kg), SpO2 96 %. Body mass index is 29.62 kg/m.   Examination: General Appearance: The patient is well-developed, well-nourished, and in no distress. Neck Circumference: 44cm Skin: Gross inspection of skin unremarkable. Head: normocephalic, no gross deformities. Eyes: no gross deformities noted. ENT: ears appear grossly normal Neurologic: Alert and oriented. No involuntary movements.    EPWORTH SLEEPINESS SCALE:  Scale:  (0)= no chance of dozing; (1)= slight chance of dozing; (2)= moderate chance of dozing; (3)= high chance of dozing  Chance  Situtation    Sitting and reading: 0    Watching TV: 2    Sitting Inactive in public: 1    As a passenger in car: 0      Lying down to rest: 3    Sitting and talking: 0    Sitting quielty after lunch: 2    In a car, stopped in traffic: 0   TOTAL SCORE:   8 out of 24    SLEEP STUDIES:  PSG (04/2021) AHI 10.4/hr, min SpO2 87% Titration (06/2021) APAP@ 8-16 cmH2O   LABS: No results found for this or any previous visit (from the past 2160 hour(s)).  Radiology: DG Hand Complete Right  Result Date: 07/22/2020 CLINICAL DATA:  Chronic right hand pain.  No known injury. EXAM: RIGHT HAND - COMPLETE 3+ VIEW COMPARISON:  None. FINDINGS: There is no evidence of fracture or dislocation. There is no evidence of arthropathy or other focal bone abnormality. Soft tissues are unremarkable. IMPRESSION: Negative. Electronically Signed   By: Lupita Raider M.D.   On: 07/22/2020 09:05    No results found.  No results found.    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 OSA (obstructive sleep apnea) PLAN OSA:   Patient evaluation suggests high risk of sleep disordered breathing due to AHI of 10.4 on PSG.  Patient has comorbid cardiovascular risk factors including: hypertension which could be exacerbated by pathologic sleep-disordered breathing.  Suggest: start APAP 8-16 to treat the patient's sleep disordered breathing. The patient was also counselled on weight loss to optimize sleep health.   2. Essential hypertension Hypertension Counseling:   The following hypertensive lifestyle modification were recommended and discussed:  1. Limiting alcohol intake to less than 1 oz/day of ethanol:(24 oz of beer  or 8 oz of wine or 2 oz of 100-proof whiskey). 2. Take baby ASA 81 mg daily. 3. Importance of regular aerobic exercise and losing weight. 4. Reduce dietary saturated fat and cholesterol intake for overall cardiovascular health. 5. Maintaining adequate dietary potassium, calcium, and magnesium intake. 6. Regular monitoring of the blood pressure. 7. Reduce sodium intake to less than 100 mmol/day (less than 2.3 gm of sodium or less than 6 gm of sodium choride)    3. CPAP use counseling CPAP Counseling: had a lengthy discussion with the patient regarding the importance of PAP therapy in management of the sleep apnea. Patient appears to understand the risk factor reduction and also understands the risks associated with untreated sleep apnea. Patient will try to make a good faith effort to remain compliant with therapy. Also instructed the patient on proper cleaning of the device including the water must be changed daily if possible and use of distilled water is preferred. Patient  understands that the machine should be regularly cleaned with appropriate recommended cleaning solutions that do not damage the PAP machine for example given white vinegar and water rinses. Other methods such as ozone treatment may not be as good as these simple methods to achieve cleaning.     General Counseling: I have discussed the findings of the evaluation and examination with Arel.  I have also discussed any further diagnostic evaluation thatmay be needed or ordered today. Eldredge verbalizes understanding of the findings of todays visit. We also reviewed his medications today and discussed drug interactions and side effects including but not limited excessive drowsiness and altered mental states. We also discussed that there is always a risk not just to him but also people around him. he has been encouraged to call the office with any questions or concerns that should arise related to todays visit.  No orders of the defined types were placed in this encounter.       I have personally obtained a history, evaluated the patient, evaluated pertinent data, formulated the assessment and plan and placed orders.  This patient was seen today by Emmaline Kluver, PA-C in collaboration with Dr. Freda Munro.    Yevonne Pax, MD [facility] Diplomate ABMS Pulmonary and Critical Care Medicine Sleep medicine

## 2021-09-15 ENCOUNTER — Other Ambulatory Visit: Payer: Self-pay | Admitting: Family Medicine

## 2021-09-15 DIAGNOSIS — I10 Essential (primary) hypertension: Secondary | ICD-10-CM

## 2021-09-15 NOTE — Telephone Encounter (Signed)
Requested Prescriptions  Pending Prescriptions Disp Refills  . lisinopril (ZESTRIL) 20 MG tablet [Pharmacy Med Name: LISINOPRIL 20 MG TABLET] 90 tablet 0    Sig: TAKE 1 TABLET BY MOUTH EVERY DAY     Cardiovascular:  ACE Inhibitors Passed - 09/15/2021  3:22 AM      Passed - Cr in normal range and within 180 days    Creat  Date Value Ref Range Status  04/19/2021 0.99 0.70 - 1.35 mg/dL Final         Passed - K in normal range and within 180 days    Potassium  Date Value Ref Range Status  04/19/2021 4.6 3.5 - 5.3 mmol/L Final         Passed - Patient is not pregnant      Passed - Last BP in normal range    BP Readings from Last 1 Encounters:  07/19/21 116/77         Passed - Valid encounter within last 6 months    Recent Outpatient Visits          4 months ago Annual physical exam   [facility] [place], Netta Neat, DO   5 months ago Suspected sleep apnea   [facility] [place], Netta Neat, DO   1 year ago Conjunctival abrasion, left, initial encounter   [facility], Netta Neat, DO   1 year ago Primary osteoarthritis of both hands   [facility] [place], Netta Neat, DO   2 years ago Annual physical exam   [facility] Althea Charon, Netta Neat, DO      Future Appointments            In 7 months Althea Charon, Netta Neat, DO [facility], [facility]

## 2021-10-28 ENCOUNTER — Encounter: Payer: Self-pay | Admitting: Family Medicine

## 2021-10-28 ENCOUNTER — Ambulatory Visit: Payer: BC Managed Care – PPO | Admitting: Family Medicine

## 2021-10-28 ENCOUNTER — Other Ambulatory Visit (HOSPITAL_COMMUNITY)
Admission: RE | Admit: 2021-10-28 | Discharge: 2021-10-28 | Disposition: A | Discharge status: Home / Self Care | Payer: BC Managed Care – PPO | Source: Ambulatory Visit | Attending: Family Medicine | Admitting: Family Medicine

## 2021-10-28 VITALS — BP 119/69 | HR 86 | Ht 72.0 in | Wt 222.0 lb

## 2021-10-28 DIAGNOSIS — N35919 Unspecified urethral stricture, male, unspecified site: Secondary | ICD-10-CM

## 2021-10-28 NOTE — Patient Instructions (Addendum)
Thank you for coming to the office today.  [facility] -1st floor [address],  [place]  [address] Phone: [phone]  Referral to Urologist, for possible Urethral Stricture or narrowing with the urethral opening.  Urine today to rule out any STD that would cause. Stay tuned for results.  Please schedule a Follow-up Appointment to: Return if symptoms worsen or fail to improve.  If you have any other questions or concerns, please feel free to call the office or send a message through [place]. You may also schedule an earlier appointment if necessary.  Additionally, you may be receiving a survey about your experience at our office within a few days to 1 week by e-mail or mail. We value your feedback.  Nobie Putnam, DO [place]

## 2021-10-28 NOTE — Progress Notes (Signed)
Subjective:    Patient ID: Kyle Buchanan, male    DOB: 10/19/1960, 61 y.o.   MRN: [id number]  Kyle Buchanan is a 61 y.o. male presenting on 10/28/2021 for prostate problem   HPI  Urethral Stricture vs Ulceration 2 weeks ago blister on penis with stinging, using vaseline ointment some improvement, denies other rash or itching or burning.  Difficulty with urinary flow with some abnormal spraying of urine  No new sexual partner or exposure Denies fever chills dysuria or discharge      07/21/2020   11:17 AM 09/13/2019    9:10 AM 03/15/2019    8:03 AM  Depression screen PHQ 2/9  Decreased Interest 2 0 0  Down, Depressed, Hopeless 0 0 0  PHQ - 2 Score 2 0 0  Altered sleeping 0    Tired, decreased energy 0    Change in appetite 0    Feeling bad or failure about yourself  0    Trouble concentrating 0    Moving slowly or fidgety/restless 0    Suicidal thoughts 0    PHQ-9 Score 2    Difficult doing work/chores Not difficult at all      Social History   Tobacco Use   Smoking status: Former    Packs/day: 1.00    Years: 9.00    Total pack years: 9.00    Types: Cigarettes   Smokeless tobacco: Former  Scientific laboratory technician Use: Never used  Substance Use Topics   Alcohol use: Not Currently    Comment: past   Drug use: Not Currently    Types: Marijuana, Cocaine    Comment: past     Review of Systems Per HPI unless specifically indicated above     Objective:    BP 119/69   Pulse 86   Ht 6' (1.829 m)   Wt 222 lb (100.7 kg)   SpO2 100%   BMI 30.11 kg/m   Wt Readings from Last 3 Encounters:  10/28/21 222 lb (100.7 kg)  07/19/21 218 lb 6.4 oz (99.1 kg)  04/21/21 213 lb 12.8 oz (97 kg)    Physical Exam Vitals and nursing note reviewed.  Constitutional:      General: He is not in acute distress.    Appearance: He is well-developed. He is not diaphoretic.  Genitourinary:    Comments: Penis appears mostly normal, there is slight defect at urethral  meatus only Skin:    General: Skin is warm and dry.  Neurological:     Mental Status: He is alert.    Results for orders placed or performed in visit on 04/19/21  TSH  Result Value Ref Range   TSH 0.66 0.40 - 4.50 mIU/L  PSA  Result Value Ref Range   PSA 0.52 < OR = 4.00 ng/mL  Hemoglobin A1c  Result Value Ref Range   Hgb A1c MFr Bld 6.2 (H) <5.7 % of total Hgb   Mean Plasma Glucose 131 mg/dL   eAG (mmol/L) 7.3 mmol/L  Lipid panel  Result Value Ref Range   Cholesterol 147 <200 mg/dL   HDL 65 > OR = 40 mg/dL   Triglycerides 59 <150 mg/dL   LDL Cholesterol (Calc) 68 mg/dL (calc)   Total CHOL/HDL Ratio 2.3 <5.0 (calc)   Non-HDL Cholesterol (Calc) 82 <130 mg/dL (calc)  CBC with Differential/Platelet  Result Value Ref Range   WBC 2.7 (L) 3.8 - 10.8 Thousand/uL   RBC 5.39 4.20 - 5.80 Million/uL  Hemoglobin 14.6 13.2 - 17.1 g/dL   HCT 45.0 38.5 - 50.0 %   MCV 83.5 80.0 - 100.0 fL   MCH 27.1 27.0 - 33.0 pg   MCHC 32.4 32.0 - 36.0 g/dL   RDW 13.1 11.0 - 15.0 %   Platelets 227 140 - 400 Thousand/uL   MPV 8.8 7.5 - 12.5 fL   Neutro Abs 1,077 (L) 1,500 - 7,800 cells/uL   Lymphs Abs 956 850 - 3,900 cells/uL   Absolute Monocytes 383 200 - 950 cells/uL   Eosinophils Absolute 243 15 - 500 cells/uL   Basophils Absolute 41 0 - 200 cells/uL   Neutrophils Relative % 39.9 %   Total Lymphocyte 35.4 %   Monocytes Relative 14.2 %   Eosinophils Relative 9.0 %   Basophils Relative 1.5 %  COMPLETE METABOLIC PANEL WITH GFR  Result Value Ref Range   Glucose, Bld 107 65 - 139 mg/dL   BUN 23 7 - 25 mg/dL   Creat 0.99 0.70 - 1.35 mg/dL   eGFR 87 > OR = 60 mL/min/1.32m   BUN/Creatinine Ratio NOT APPLICABLE 6 - 22 (calc)   Sodium 140 135 - 146 mmol/L   Potassium 4.6 3.5 - 5.3 mmol/L   Chloride 106 98 - 110 mmol/L   CO2 25 20 - 32 mmol/L   Calcium 9.5 8.6 - 10.3 mg/dL   Total Protein 6.8 6.1 - 8.1 g/dL   Albumin 4.1 3.6 - 5.1 g/dL   Globulin 2.7 1.9 - 3.7 g/dL (calc)   AG Ratio 1.5 1.0  - 2.5 (calc)   Total Bilirubin 0.5 0.2 - 1.2 mg/dL   Alkaline phosphatase (APISO) 48 35 - 144 U/L   AST 26 10 - 35 U/L   ALT 24 9 - 46 U/L      Assessment & Plan:   Problem List Items Addressed This Visit   None Visit Diagnoses     Stricture of male urethra, unspecified stricture type    -  Primary   Relevant Orders   Ambulatory referral to Urology   [facility]      referral to Urology for consult on urinary stricture with abnormal urinary flow spraying different directions had some sign of blister at tip of urethra that has healed but still with abnormal urine flow, monogamous no new sexual partner or other signs or symptoms of STD   Will order GC Chlamydia urine screening today  No orders of the defined types were placed in this encounter.     Follow up plan: Return if symptoms worsen or fail to improve.   ANobie Putnam [facility] 10/28/2021, 10:30 AM

## 2021-11-01 LAB — GC/CHLAMYDIA PROBE AMP (~~LOC~~) NOT AT ARMC
Chlamydia: NEGATIVE
Comment: NEGATIVE
Comment: NORMAL
Neisseria Gonorrhea: NEGATIVE

## 2021-11-03 ENCOUNTER — Encounter: Payer: Self-pay | Admitting: Urology

## 2021-11-03 ENCOUNTER — Ambulatory Visit: Payer: BC Managed Care – PPO | Admitting: Urology

## 2021-11-03 VITALS — BP 116/80 | HR 82 | Ht 72.0 in | Wt 220.0 lb

## 2021-11-03 DIAGNOSIS — R300 Dysuria: Secondary | ICD-10-CM

## 2021-11-03 DIAGNOSIS — R399 Unspecified symptoms and signs involving the genitourinary system: Secondary | ICD-10-CM | POA: Diagnosis not present

## 2021-11-03 NOTE — Progress Notes (Signed)
   11/03/21 9:51 AM   Kyle Buchanan February 20, 1960 [id number]  CC: Urinary symptoms, possible penile lesion  HPI: 61 year old male who presented to his PCP with about a month of some spraying urinary stream and mild dysuria that resolved spontaneously.  STD testing was negative, but he apparently had some abnormal findings on physical exam and was referred to urology.  He reports his symptoms have almost completely resolved and really denies significant urinary complaints today.  He denies any gross hematuria or dysuria.  Prior to this episode, denies any urinary symptoms.  Recent PSA normal at 0.5.   PMH: Past Medical History:  Diagnosis Date   Hepatitis C     Surgical History: Past Surgical History:  Procedure Laterality Date   COLONOSCOPY WITH PROPOFOL N/A 04/10/2019   Procedure: COLONOSCOPY WITH PROPOFOL;  Surgeon: Lin Landsman, MD;  Location: [facility] ENDOSCOPY;  Service: Gastroenterology;  Laterality: N/A;  PRIORITY 4      Family History: Family History  Problem Relation Age of Onset   Cancer Father        bone cancer   Cancer Sister        uncertain type    Social History:  reports that he has quit smoking. His smoking use included cigarettes. He has a 9.00 pack-year smoking history. He has been exposed to tobacco smoke. He has quit using smokeless tobacco. He reports that he does not currently use alcohol. He reports that he does not currently use drugs after having used the following drugs: Marijuana and Cocaine.  Physical Exam: BP 116/80   Pulse 82   Ht 6' (1.829 m)   Wt 220 lb (99.8 kg)   BMI 29.84 kg/m    Constitutional:  Alert and oriented, No acute distress. Cardiovascular: No clubbing, cyanosis, or edema. Respiratory: Normal respiratory effort, no increased work of breathing. GI: Abdomen is soft, nontender, nondistended, no abdominal masses GU: Phallus with patent meatus, no lesions, no evidence of meatal stenosis, no penile lesions, testicles  20 cc and descended bilaterally  Laboratory Data: Reviewed  Assessment & Plan:   61 year old male with a few weeks of some spraying urinary stream and very mild dysuria that has since resolved spontaneously.  He really denies any complaints today, and physical exam is completely benign.  STD testing was negative.  We discussed checking a urinalysis to rule out any microscopic hematuria or other abnormalities, but he defers today with his resolution of symptoms.  Return precautions were discussed at length including gross hematuria, recurrent urinary symptoms, or dysuria.  Follow-up as needed, would recommend urinalysis and consider cystoscopy if recurrent symptoms in the future   [name], MD 11/03/2021  [facility] [address] [place], [place] [address] [phone]

## 2021-12-14 ENCOUNTER — Other Ambulatory Visit: Payer: Self-pay | Admitting: Family Medicine

## 2021-12-14 DIAGNOSIS — I10 Essential (primary) hypertension: Secondary | ICD-10-CM

## 2021-12-14 NOTE — Telephone Encounter (Signed)
Requested Prescriptions  Pending Prescriptions Disp Refills   lisinopril (ZESTRIL) 20 MG tablet [Pharmacy Med Name: LISINOPRIL 20 MG TABLET] 90 tablet 0    Sig: TAKE 1 TABLET BY MOUTH EVERY DAY     Cardiovascular:  ACE Inhibitors Failed - 12/14/2021  2:04 AM      Failed - Cr in normal range and within 180 days    Creat  Date Value Ref Range Status  04/19/2021 0.99 0.70 - 1.35 mg/dL Final         Failed - K in normal range and within 180 days    Potassium  Date Value Ref Range Status  04/19/2021 4.6 3.5 - 5.3 mmol/L Final         Passed - Patient is not pregnant      Passed - Last BP in normal range    BP Readings from Last 1 Encounters:  11/03/21 116/80         Passed - Valid encounter within last 6 months    Recent Outpatient Visits           1 month ago Stricture of male urethra, unspecified stricture type   [facility] Smitty Cords, DO   7 months ago Annual physical exam   [facility] Smitty Cords, DO   8 months ago Suspected sleep apnea   [facility] Smitty Cords, DO   1 year ago Conjunctival abrasion, left, initial encounter   [facility] Smitty Cords, DO   1 year ago Primary osteoarthritis of both hands   [facility] [place], Netta Neat, DO       Future Appointments             In 4 months Althea Charon, Netta Neat, DO [facility], [facility]

## 2022-01-25 ENCOUNTER — Telehealth: Payer: Self-pay | Admitting: Family Medicine

## 2022-01-25 NOTE — Telephone Encounter (Signed)
Per pt he lost the following printed script and can not find it anywhere, states never used. Pls advise [phone]  Disp Refills Start End   sildenafil (REVATIO) 20 MG tablet 90 tablet 3 04/21/2021    Sig: TAKE 1-5 TABS ABOUT 30 MIN PRIOR TO SEX. START WITH 1 AND INCREASE AS NEEDED.   Class: Print

## 2022-03-21 ENCOUNTER — Other Ambulatory Visit: Payer: Self-pay | Admitting: Family Medicine

## 2022-03-21 DIAGNOSIS — I10 Essential (primary) hypertension: Secondary | ICD-10-CM

## 2022-03-22 NOTE — Telephone Encounter (Signed)
Requested Prescriptions  Pending Prescriptions Disp Refills   lisinopril (ZESTRIL) 20 MG tablet [Pharmacy Med Name: LISINOPRIL 20 MG TABLET] 90 tablet 0    Sig: TAKE 1 TABLET BY MOUTH EVERY DAY     Cardiovascular:  ACE Inhibitors Failed - 03/21/2022  1:46 AM      Failed - Cr in normal range and within 180 days    Creat  Date Value Ref Range Status  04/19/2021 0.99 0.70 - 1.35 mg/dL Final         Failed - K in normal range and within 180 days    Potassium  Date Value Ref Range Status  04/19/2021 4.6 3.5 - 5.3 mmol/L Final         Passed - Patient is not pregnant      Passed - Last BP in normal range    BP Readings from Last 1 Encounters:  11/03/21 116/80         Passed - Valid encounter within last 6 months    Recent Outpatient Visits           4 months ago Stricture of male urethra, unspecified stricture type   [place], DO   11 months ago Annual physical exam   [facility] Olin Hauser, DO   11 months ago Suspected sleep apnea   [place], DO   1 year ago Conjunctival abrasion, left, initial encounter   [place], DO   1 year ago Primary osteoarthritis of both hands   [facility] [place], Devonne Doughty, DO       Future Appointments             In 1 month [place], Devonne Doughty, [facility], [facility]

## 2022-04-19 ENCOUNTER — Other Ambulatory Visit: Payer: Self-pay

## 2022-04-19 DIAGNOSIS — R351 Nocturia: Secondary | ICD-10-CM

## 2022-04-19 DIAGNOSIS — E663 Overweight: Secondary | ICD-10-CM

## 2022-04-19 DIAGNOSIS — Z0000 Encounter for general adult medical examination without abnormal findings: Secondary | ICD-10-CM

## 2022-04-19 DIAGNOSIS — R7303 Prediabetes: Secondary | ICD-10-CM

## 2022-04-19 DIAGNOSIS — I10 Essential (primary) hypertension: Secondary | ICD-10-CM

## 2022-04-20 ENCOUNTER — Other Ambulatory Visit: Payer: BC Managed Care – PPO

## 2022-04-20 ENCOUNTER — Other Ambulatory Visit: Payer: Self-pay | Admitting: Family Medicine

## 2022-04-20 DIAGNOSIS — N522 Drug-induced erectile dysfunction: Secondary | ICD-10-CM

## 2022-04-21 LAB — COMPLETE METABOLIC PANEL WITH GFR
AG Ratio: 1.7 (calc) (ref 1.0–2.5)
ALT: 29 U/L (ref 9–46)
AST: 27 U/L (ref 10–35)
Albumin: 4.3 g/dL (ref 3.6–5.1)
Alkaline phosphatase (APISO): 57 U/L (ref 35–144)
BUN: 20 mg/dL (ref 7–25)
CO2: 24 mmol/L (ref 20–32)
Calcium: 9.4 mg/dL (ref 8.6–10.3)
Chloride: 106 mmol/L (ref 98–110)
Creat: 1.01 mg/dL (ref 0.70–1.35)
Globulin: 2.6 g/dL (calc) (ref 1.9–3.7)
Glucose, Bld: 158 mg/dL — ABNORMAL HIGH (ref 65–139)
Potassium: 4.2 mmol/L (ref 3.5–5.3)
Sodium: 141 mmol/L (ref 135–146)
Total Bilirubin: 0.4 mg/dL (ref 0.2–1.2)
Total Protein: 6.9 g/dL (ref 6.1–8.1)
eGFR: 84 mL/min/{1.73_m2} (ref >=60)

## 2022-04-21 LAB — CBC WITH DIFFERENTIAL/PLATELET
Absolute Monocytes: 357 cells/uL (ref 200–950)
Basophils Absolute: 31 cells/uL (ref 0–200)
Basophils Relative: 0.9 %
Eosinophils Absolute: 197 cells/uL (ref 15–500)
Eosinophils Relative: 5.8 %
HCT: 42.6 % (ref 38.5–50.0)
Hemoglobin: 14 g/dL (ref 13.2–17.1)
Lymphs Abs: 1513 cells/uL (ref 850–3900)
MCH: 27.2 pg (ref 27.0–33.0)
MCHC: 32.9 g/dL (ref 32.0–36.0)
MCV: 82.9 fL (ref 80.0–100.0)
MPV: 8.6 fL (ref 7.5–12.5)
Monocytes Relative: 10.5 %
Neutro Abs: 1302 cells/uL — ABNORMAL LOW (ref 1500–7800)
Neutrophils Relative %: 38.3 %
Platelets: 218 10*3/uL (ref 140–400)
RBC: 5.14 10*6/uL (ref 4.20–5.80)
RDW: 13.3 % (ref 11.0–15.0)
Total Lymphocyte: 44.5 %
WBC: 3.4 10*3/uL — ABNORMAL LOW (ref 3.8–10.8)

## 2022-04-21 LAB — LIPID PANEL
Cholesterol: 170 mg/dL (ref <200)
HDL: 70 mg/dL (ref >=40)
LDL Cholesterol (Calc): 69 mg/dL (calc)
Non-HDL Cholesterol (Calc): 100 mg/dL (calc) (ref <130)
Total CHOL/HDL Ratio: 2.4 (calc) (ref <5.0)
Triglycerides: 215 mg/dL — ABNORMAL HIGH (ref <150)

## 2022-04-21 LAB — HEMOGLOBIN A1C
Hgb A1c MFr Bld: 6.5 % of total Hgb — ABNORMAL HIGH (ref <5.7)
Mean Plasma Glucose: 140 mg/dL
eAG (mmol/L): 7.7 mmol/L

## 2022-04-21 LAB — PSA: PSA: 0.57 ng/mL (ref <=4.00)

## 2022-04-21 LAB — TSH: TSH: 0.62 mIU/L (ref 0.40–4.50)

## 2022-04-26 ENCOUNTER — Encounter: Payer: Self-pay | Admitting: Family Medicine

## 2022-04-26 ENCOUNTER — Ambulatory Visit (INDEPENDENT_AMBULATORY_CARE_PROVIDER_SITE_OTHER): Payer: BC Managed Care – PPO | Admitting: Family Medicine

## 2022-04-26 VITALS — BP 122/72 | HR 74 | Ht 72.0 in | Wt 218.4 lb

## 2022-04-26 DIAGNOSIS — Z0000 Encounter for general adult medical examination without abnormal findings: Secondary | ICD-10-CM

## 2022-04-26 DIAGNOSIS — I10 Essential (primary) hypertension: Secondary | ICD-10-CM | POA: Diagnosis not present

## 2022-04-26 DIAGNOSIS — R7303 Prediabetes: Secondary | ICD-10-CM | POA: Diagnosis not present

## 2022-04-26 DIAGNOSIS — N522 Drug-induced erectile dysfunction: Secondary | ICD-10-CM

## 2022-04-26 MED ORDER — LISINOPRIL 20 MG PO TABS: 20.0000 mg | ORAL_TABLET | Freq: Every day | ORAL | 3 refills | Status: DC

## 2022-04-26 MED ORDER — SILDENAFIL CITRATE 20 MG PO TABS: ORAL_TABLET | ORAL | 5 refills | Status: DC

## 2022-04-26 NOTE — Patient Instructions (Addendum)
Thank you for coming to the office today.  Printed Sildenafil take to [facility] w/ goodrx coupon  Refilled Lisinopril with future refills  Recent Labs    04/20/22 0815  HGBA1C 6.5*   Try to limit excess carb with muffins and starches.  Goal to resume exercise   Diet Recommendations for Preventing Diabetes   REDUCE Starchy (carb) foods include: Bread, rice, pasta, potatoes, corn, crackers, bagels, muffins, all baked goods.   FRUITS - LIMIT these HIGH sugar/carb fruits = Pineapple, Watermelon, Bananas - OKAY with these MEDIUM sugar/carb fruits = Citrus, Oranges, Grapes - PREFER these LOW sugar/carb fruits = Apples, Berries, Pears, Plums  Protein foods include: Meat, fish, poultry, eggs, dairy foods, and beans such as pinto and kidney beans (beans also provide carbohydrate).   1. Eat at least 3 meals and 1-2 snacks per day. Never go more than 4-5 hours while awake without eating.   2. Limit starchy foods to TWO per meal and ONE per snack. ONE portion of a starchy  food is equal to the following:   - ONE slice of bread (or its equivalent, such as half of a hamburger bun).   - 1/2 cup of a "scoopable" starchy food such as potatoes or rice.   - 1 OUNCE (28 grams) of starchy snacks (crackers or pretzels, look on label).   - 15 grams of carbohydrate as shown on food label.   3. Both lunch and dinner should include a protein food, a carb food, and vegetables.   - Obtain twice as many veg's as protein or carbohydrate foods for both lunch and dinner.   - Try to keep frozen veg's on hand for a quick vegetable serving.     - Fresh or frozen veg's are best.   4. Breakfast should always include protein.     Please schedule a Follow-up Appointment to: No follow-ups on file.  If you have any other questions or concerns, please feel free to call the office or send a message through MyChart. You may also schedule an earlier appointment if necessary.  Additionally, you may be receiving a survey  about your experience at our office within a few days to 1 week by e-mail or mail. We value your feedback.  Saralyn Pilar, DO [facility] [place], [place]

## 2022-04-26 NOTE — Progress Notes (Unsigned)
Subjective:    Patient ID: Kyle Buchanan, male    DOB: Dec 05, 1960, 62 y.o.   MRN: [id number]  Kyle Buchanan is a 62 y.o. male presenting on 04/26/2022 for Annual Exam   HPI  Here for Annual Physical and Lab Review   Pre-Diabetes / Overweight BMI >29 Elevated A1c up to 6.5 At risk of type 2 diabetes now, the rest of the blood work is in normal range. Except Triglyceride 215 mildly elevated due to higher sugar.   Additional lab review Kidney liver prostate normal.   Goal to improve diet now CBGs: Not checking CBG Meds: Never on med Currently not on ACEi / ARB Lifestyle: - weight down 2 lbs, overall stable >6 months - Diet (goal to limit carb) - Exercise (He does walking and exercising back on bike) Denies hypoglycemia, polyuria, visual changes, numbness or tingling.   CHRONIC HTN See above lifestyle Today he is doing well, BP is controlled Taking Lisinopril 20mg  daily Reports good compliance, took meds today. Tolerating well, w/o complaints. Denies CP, dyspnea, HA, edema, dizziness / lightheadedness   HYPERLIPIDEMIA: - Reports no concerns. Last lipid panel 04/2022 with hyperTG other normal LDL result Controlled on diet and lifestyle   Erectile Dysfunction Improved, had sildenafil but now rarely using it. He is doing much better. Re order and GoodRx needs new order   Additionally - followed by Neurology Kyle Buchanan) Dr Kyle Buchanan - Admits irritable on Lyrica, off Gabapentin   Health Maintenance:   Prostate CA Screening: Last PSA 0.57 (04/2022). Currently asymptomatic BPH LUTS. No known family history of prostate CA.    Colonoscopy last done 04/10/19, next due 7 years 2028     04/26/2022    1:28 PM 07/21/2020   11:17 AM 09/13/2019    9:10 AM  Depression screen PHQ 2/9  Decreased Interest 0 2 0  Down, Depressed, Hopeless 0 0 0  PHQ - 2 Score 0 2 0  Altered sleeping 0 0   Tired, decreased energy 0 0   Change in appetite 0 0   Feeling bad or failure about  yourself  0 0   Trouble concentrating 0 0   Moving slowly or fidgety/restless 0 0   Suicidal thoughts 0 0   PHQ-9 Score 0 2   Difficult doing work/chores Not difficult at all Not difficult at all       04/26/2022    1:29 PM 07/21/2020   11:17 AM  GAD 7 : Generalized Anxiety Score  Nervous, Anxious, on Edge 0 0  Control/stop worrying 0 0  Worry too much - different things 0 0  Trouble relaxing 0 0  Restless 0 0  Easily annoyed or irritable 0 0  Afraid - awful might happen 0 0  Total GAD 7 Score 0 0  Anxiety Difficulty  Not difficult at all      Past Medical History:  Diagnosis Date   Hepatitis C    Past Surgical History:  Procedure Laterality Date   COLONOSCOPY WITH PROPOFOL N/A 04/10/2019   Procedure: COLONOSCOPY WITH PROPOFOL;  Surgeon: Toney Reil, MD;  Location: [facility] ENDOSCOPY;  Service: Gastroenterology;  Laterality: N/A;  PRIORITY 4   Social History   Socioeconomic History   Marital status: Married    Spouse name: Not on file   Number of children: Not on file   Years of education: Not on file   Highest education level: Not on file  Occupational History   Not on file  Tobacco Use   Smoking status: Former    Packs/day: 1.00    Years: 9.00    Additional pack years: 0.00    Total pack years: 9.00    Types: Cigarettes    Passive exposure: Past   Smokeless tobacco: Former  Building services engineer Use: Never used  Substance and Sexual Activity   Alcohol use: Not Currently    Comment: past   Drug use: Not Currently    Types: Marijuana, Cocaine    Comment: past    Sexual activity: Yes  Other Topics Concern   Not on file  Social History Narrative   Not on file   Social Determinants of Health   Financial Resource Strain: Not on file  Food Insecurity: Not on file  Transportation Needs: Not on file  Physical Activity: Not on file  Stress: Not on file  Social Connections: Not on file  Intimate Partner Violence: Not on file   Family History  Problem  Relation Age of Onset   Cancer Father        bone cancer   Cancer Sister        uncertain type   Current Outpatient Medications on File Prior to Visit  Medication Sig   Multiple Vitamin (MULTI-VITAMINS) TABS Take by mouth.   No current facility-administered medications on file prior to visit.    Review of Systems  Constitutional:  Negative for activity change, appetite change, chills, diaphoresis, fatigue and fever.  HENT:  Negative for congestion and hearing loss.   Eyes:  Negative for visual disturbance.  Respiratory:  Negative for cough, chest tightness, shortness of breath and wheezing.   Cardiovascular:  Negative for chest pain, palpitations and leg swelling.  Gastrointestinal:  Negative for abdominal pain, constipation, diarrhea, nausea and vomiting.  Genitourinary:  Negative for dysuria, frequency and hematuria.  Musculoskeletal:  Negative for arthralgias and neck pain.  Skin:  Negative for rash.  Neurological:  Negative for dizziness, weakness, light-headedness, numbness and headaches.  Hematological:  Negative for adenopathy.  Psychiatric/Behavioral:  Negative for behavioral problems, dysphoric mood and sleep disturbance.    Per HPI unless specifically indicated above      Objective:    BP 122/72 (BP Location: Left Arm, Patient Position: Sitting)   Pulse 74   Ht 6' (1.829 m)   Wt 218 lb 6.4 oz (99.1 kg)   SpO2 99%   BMI 29.62 kg/m   Wt Readings from Last 3 Encounters:  04/26/22 218 lb 6.4 oz (99.1 kg)  11/03/21 220 lb (99.8 kg)  10/28/21 222 lb (100.7 kg)    Physical Exam Vitals and nursing note reviewed.  Constitutional:      General: He is not in acute distress.    Appearance: He is well-developed. He is not diaphoretic.     Comments: Well-appearing, comfortable, cooperative  HENT:     Head: Normocephalic and atraumatic.     Right Ear: Tympanic membrane, ear canal and external ear normal.     Left Ear: Tympanic membrane, ear canal and external ear  normal.     Ears:     Comments: Some cerumen Eyes:     General:        Right eye: No discharge.        Left eye: No discharge.     Conjunctiva/sclera: Conjunctivae normal.     Pupils: Pupils are equal, round, and reactive to light.  Neck:     Thyroid: No thyromegaly.  Cardiovascular:  Rate and Rhythm: Normal rate and regular rhythm.     Pulses: Normal pulses.     Heart sounds: Normal heart sounds. No murmur heard. Pulmonary:     Effort: Pulmonary effort is normal. No respiratory distress.     Breath sounds: Normal breath sounds. No wheezing or rales.  Abdominal:     General: Bowel sounds are normal. There is no distension.     Palpations: Abdomen is soft. There is no mass.     Tenderness: There is no abdominal tenderness.  Musculoskeletal:        General: No tenderness. Normal range of motion.     Cervical back: Normal range of motion and neck supple.     Right lower leg: No edema.     Left lower leg: No edema.     Comments: Upper / Lower Extremities: - Normal muscle tone, strength bilateral upper extremities 5/5, lower extremities 5/5  Lymphadenopathy:     Cervical: No cervical adenopathy.  Skin:    General: Skin is warm and dry.     Findings: No erythema or rash.  Neurological:     Mental Status: He is alert and oriented to person, place, and time.     Comments: Distal sensation intact to light touch all extremities  Psychiatric:        Mood and Affect: Mood normal.        Behavior: Behavior normal.        Thought Content: Thought content normal.     Comments: Well groomed, good eye contact, normal speech and thoughts       Results for orders placed or performed in visit on 04/19/22  TSH  Result Value Ref Range   TSH 0.62 0.40 - 4.50 mIU/L  PSA  Result Value Ref Range   PSA 0.57 < OR = 4.00 ng/mL  Hemoglobin A1c  Result Value Ref Range   Hgb A1c MFr Bld 6.5 (H) <5.7 % of total Hgb   Mean Plasma Glucose 140 mg/dL   eAG (mmol/L) 7.7 mmol/L  Lipid panel   Result Value Ref Range   Cholesterol 170 <200 mg/dL   HDL 70 > OR = 40 mg/dL   Triglycerides 191215 (H) <150 mg/dL   LDL Cholesterol (Calc) 69 mg/dL (calc)   Total CHOL/HDL Ratio 2.4 <5.0 (calc)   Non-HDL Cholesterol (Calc) 100 <130 mg/dL (calc)  CBC with Differential/Platelet  Result Value Ref Range   WBC 3.4 (L) 3.8 - 10.8 Thousand/uL   RBC 5.14 4.20 - 5.80 Million/uL   Hemoglobin 14.0 13.2 - 17.1 g/dL   HCT 47.842.6 29.538.5 - 62.150.0 %   MCV 82.9 80.0 - 100.0 fL   MCH 27.2 27.0 - 33.0 pg   MCHC 32.9 32.0 - 36.0 g/dL   RDW 30.813.3 65.711.0 - 84.615.0 %   Platelets 218 140 - 400 Thousand/uL   MPV 8.6 7.5 - 12.5 fL   Neutro Abs 1,302 (L) 1,500 - 7,800 cells/uL   Lymphs Abs 1,513.0 850 - 3,900 cells/uL   Absolute Monocytes 357 200 - 950 cells/uL   Eosinophils Absolute 197 15 - 500 cells/uL   Basophils Absolute 31 0 - 200 cells/uL   Neutrophils Relative % 38.3 %   Total Lymphocyte 44.5 %   Monocytes Relative 10.5 %   Eosinophils Relative 5.8 %   Basophils Relative 0.9 %  COMPLETE METABOLIC PANEL WITH GFR  Result Value Ref Range   Glucose, Bld 158 (H) 65 - 139 mg/dL   BUN 20 7 -  25 mg/dL   Creat 9.52 8.41 - 3.24 mg/dL   eGFR 84 > OR = 60 MW/NUU/7.25D6   BUN/Creatinine Ratio SEE NOTE: 6 - 22 (calc)   Sodium 141 135 - 146 mmol/L   Potassium 4.2 3.5 - 5.3 mmol/L   Chloride 106 98 - 110 mmol/L   CO2 24 20 - 32 mmol/L   Calcium 9.4 8.6 - 10.3 mg/dL   Total Protein 6.9 6.1 - 8.1 g/dL   Albumin 4.3 3.6 - 5.1 g/dL   Globulin 2.6 1.9 - 3.7 g/dL (calc)   AG Ratio 1.7 1.0 - 2.5 (calc)   Total Bilirubin 0.4 0.2 - 1.2 mg/dL   Alkaline phosphatase (APISO) 57 35 - 144 U/L   AST 27 10 - 35 U/L   ALT 29 9 - 46 U/L      Assessment & Plan:   Problem List Items Addressed This Visit     Essential hypertension   Relevant Medications   sildenafil (REVATIO) 20 MG tablet   lisinopril (ZESTRIL) 20 MG tablet   Other Visit Diagnoses     Annual physical exam    -  Primary   Drug-induced erectile dysfunction        Relevant Medications   sildenafil (REVATIO) 20 MG tablet       Updated Health Maintenance information Reviewed recent lab results with patient Encouraged improvement to lifestyle with diet and exercise Goal of weight loss  ***   Meds ordered this encounter  Medications   sildenafil (REVATIO) 20 MG tablet    Sig: TAKE 1-5 TABS ABOUT 30 MIN PRIOR TO SEX. START WITH 1 AND INCREASE AS NEEDED.    Dispense:  60 tablet    Refill:  5   lisinopril (ZESTRIL) 20 MG tablet    Sig: Take 1 tablet (20 mg total) by mouth daily.    Dispense:  90 tablet    Refill:  3    Add future refills      Follow up plan: Return in about 6 months (around 10/26/2022) for 6 month PreDM A1c.   Saralyn Pilar, DO [facility] 04/26/2022, 1:34 PM

## 2022-04-27 ENCOUNTER — Encounter: Payer: BC Managed Care – PPO | Admitting: Family Medicine

## 2022-04-27 NOTE — Assessment & Plan Note (Signed)
Controlled BP, low normal ?Home BP normal ?No known complications ?OFF Amlodipine 5-10mg due to pedal edema ?  ? ?Plan:  ?1. Continue Lisinopril 20mg daily ?2. Encourage improved lifestyle - low sodium diet, regular exercise ?3. Continue monitor BP outside office, bring readings to next visit, if persistently >140/90 or new symptoms notify office sooner ?

## 2022-04-27 NOTE — Assessment & Plan Note (Signed)
A1c up to 6.5 at risk of progression to T2DM  Plan:  1. Not on any therapy currently  2. Encourage improved lifestyle - low carb, low sugar diet, reduce portion size, continue improving regular exercise

## 2022-06-07 IMAGING — DX DG HAND COMPLETE 3+V*R*
4 series · 4 of 4 positions shown · non-contrast
Comparison: None.

CLINICAL DATA: Chronic right hand pain.  No known injury.

EXAM:
RIGHT HAND - COMPLETE 3+ VIEW

[hand ap]
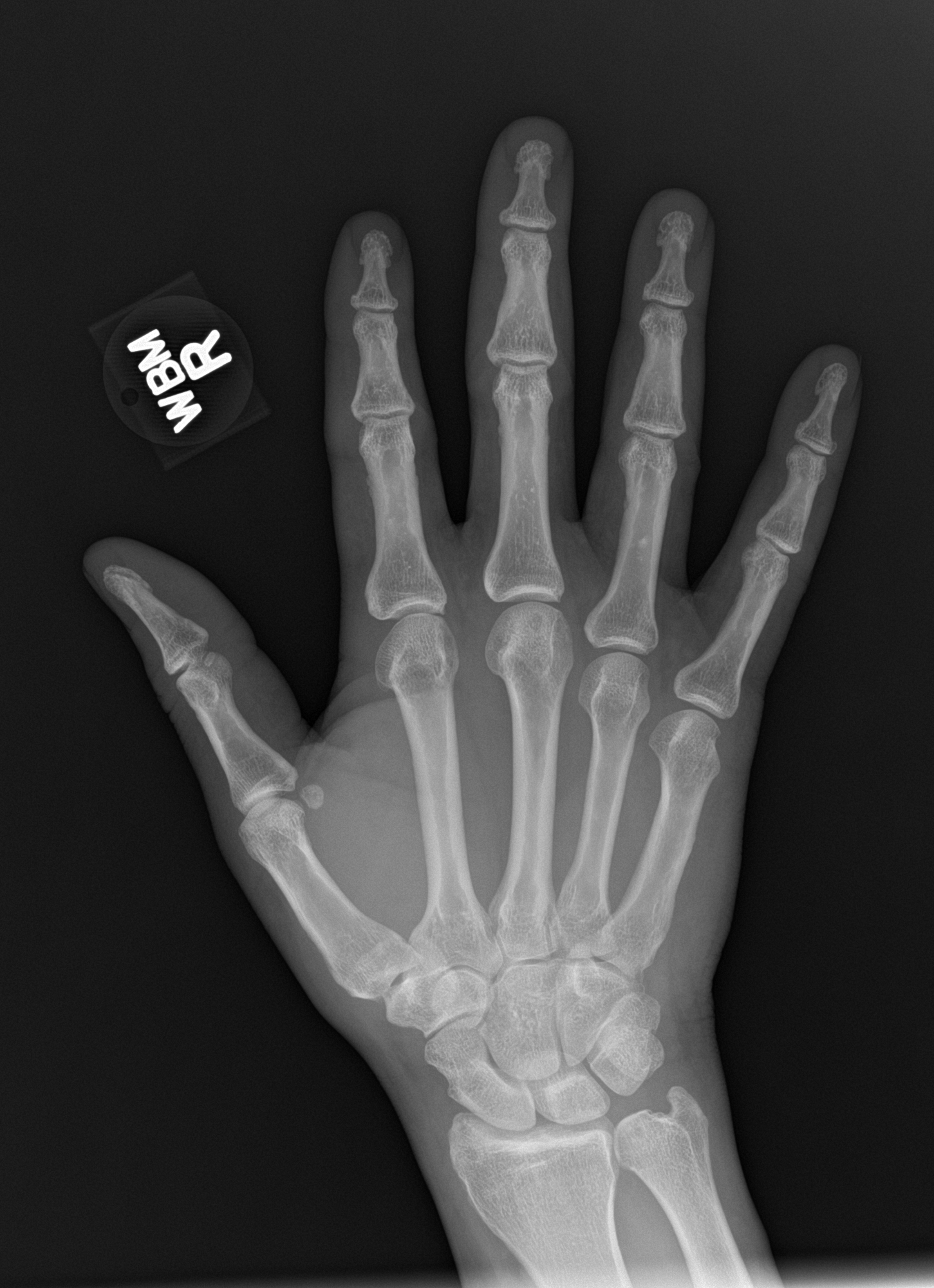

[hand obl]
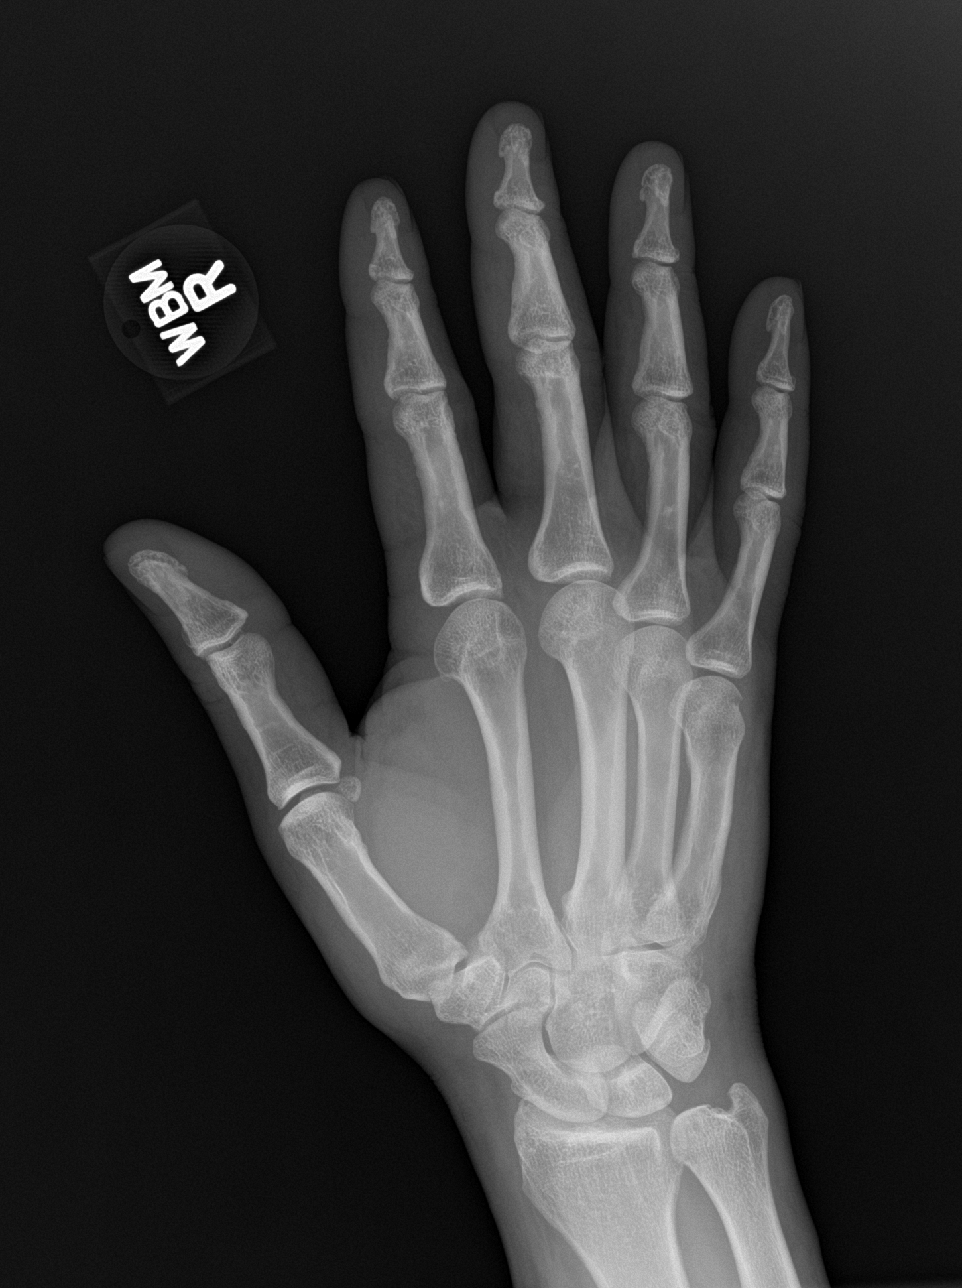

[hand lat (1 of 2)]
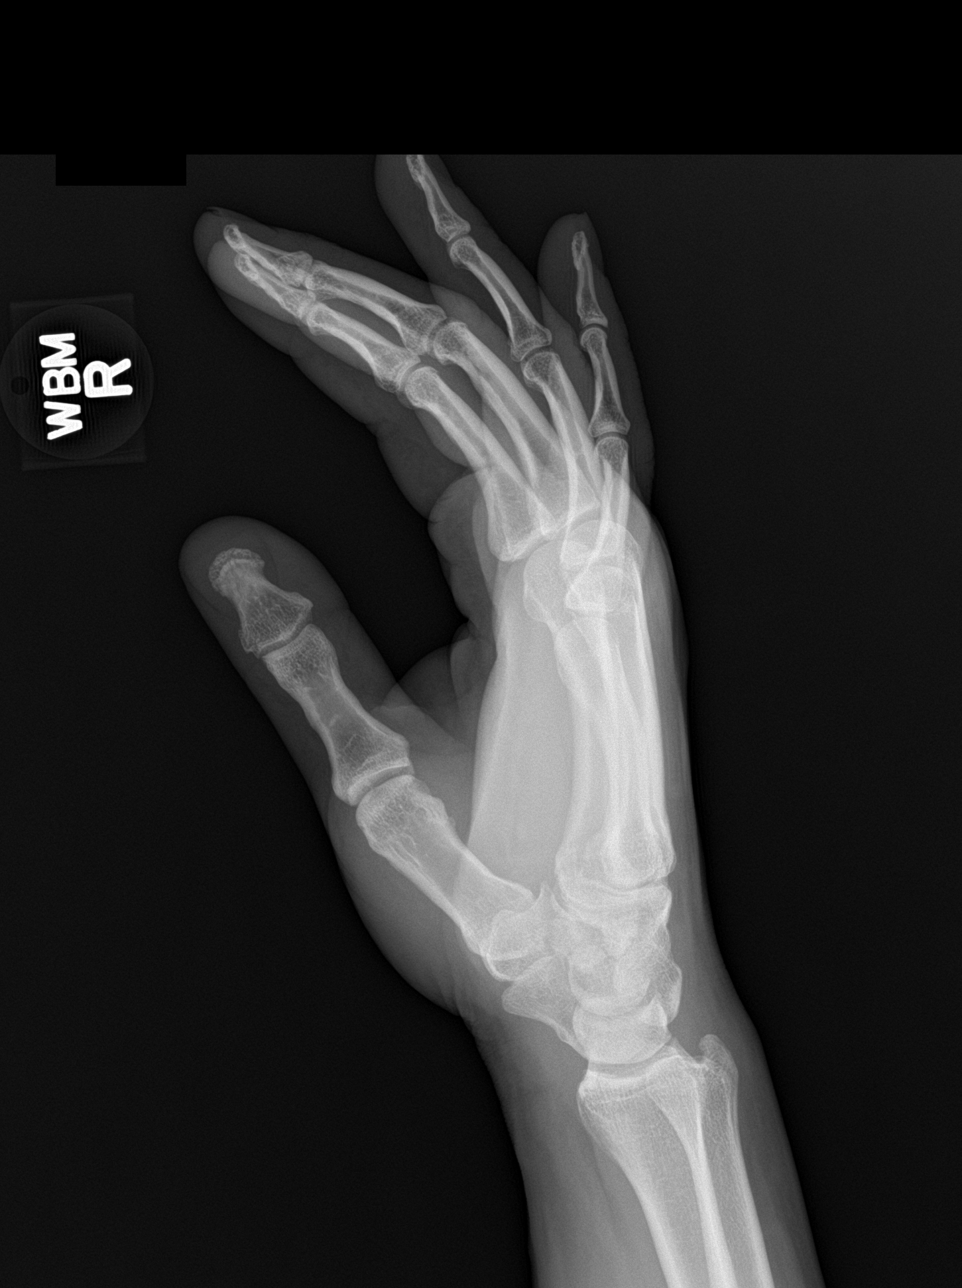

[hand lat (2 of 2)]
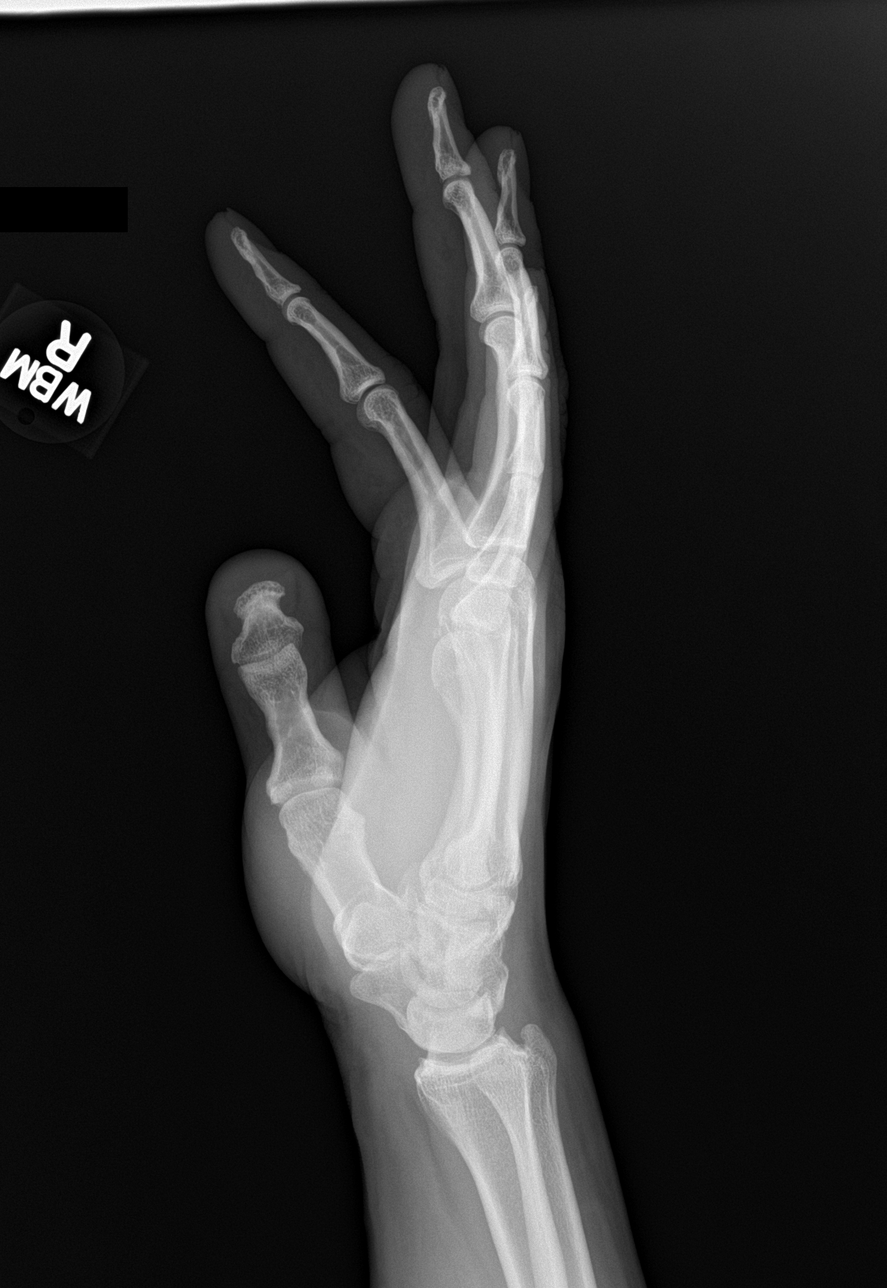

[4 of 4 positions shown; findings below may reference images not displayed]

FINDINGS: There is no evidence of fracture or dislocation. There is no
evidence of arthropathy or other focal bone abnormality. Soft
tissues are unremarkable.
IMPRESSION: Negative.

## 2022-10-26 ENCOUNTER — Encounter: Payer: Self-pay | Admitting: Family Medicine

## 2022-10-26 ENCOUNTER — Ambulatory Visit: Payer: BC Managed Care – PPO | Admitting: Family Medicine

## 2022-10-26 VITALS — BP 116/70 | HR 83 | Ht 72.0 in | Wt 217.0 lb

## 2022-10-26 DIAGNOSIS — E663 Overweight: Secondary | ICD-10-CM | POA: Diagnosis not present

## 2022-10-26 DIAGNOSIS — R7303 Prediabetes: Secondary | ICD-10-CM

## 2022-10-26 DIAGNOSIS — Z23 Encounter for immunization: Secondary | ICD-10-CM | POA: Diagnosis not present

## 2022-10-26 DIAGNOSIS — I10 Essential (primary) hypertension: Secondary | ICD-10-CM | POA: Diagnosis not present

## 2022-10-26 DIAGNOSIS — G4733 Obstructive sleep apnea (adult) (pediatric): Secondary | ICD-10-CM

## 2022-10-26 LAB — POCT GLYCOSYLATED HEMOGLOBIN (HGB A1C): Hemoglobin A1C: 6 % — AB (ref 4.0–5.6)

## 2022-10-26 NOTE — Progress Notes (Unsigned)
Subjective:    Patient ID: Kyle Buchanan, male    DOB: 11/25/1960, 62 y.o.   MRN: [id number]  Kyle Buchanan is a 62 y.o. male presenting on 10/26/2022 for Diabetes   HPI  Discussed the use of AI scribe software for clinical note transcription with the patient, who gave verbal consent to proceed.    Pre-Diabetes / Overweight BMI >29 Significant improved updated A1c today 6.0, previously 04/2022 prior A1c up to 6.5 He has overhauled diet and lifestyle Limiting carb starch muffins. Avoiding juice. Increased vegetables, increased fruit He is exercising regularly and doing cardio and strength training. Meds: Never on med Currently on ACEi Lifestyle: - weight down 2 lbs, overall stable >6 months - Diet (goal to limit carb) - Exercise (He does walking and exercising back on bike) Denies hypoglycemia, polyuria, visual changes, numbness or tingling.  OSA, on CPAP - Patient reports prior history of dx OSA and on CPAP - Today reports that sleep apnea is well controlled. He uses the CPAP machine every night. Tolerates the machine well, and thinks that sleeps better with it and feels good. No new concerns or symptoms.   CHRONIC HTN See above lifestyle Today he is doing well, BP is controlled Taking Lisinopril 20mg  daily Reports good compliance, took meds today. Tolerating well, w/o complaints. Denies CP, dyspnea, HA, edema, dizziness / lightheadedness   HYPERLIPIDEMIA: - Reports no concerns. Last lipid panel 04/2022 with hyperTG other normal LDL result Controlled on diet and lifestyle   Erectile Dysfunction Improved, had sildenafil but now rarely using it. He is doing much better. Re order and GoodRx needs new order   Additionally - followed by Neurology [name] Dr Malvin Johns - Admits irritable on Lyrica, off Gabapentin    Health Maintenance:     Colonoscopy last done 04/10/19, next due 7 years 2028  Flu Shot was completed at work 10/13/22     04/26/2022     1:28 PM 07/21/2020   11:17 AM 09/13/2019    9:10 AM  Depression screen PHQ 2/9  Decreased Interest 0 2 0  Down, Depressed, Hopeless 0 0 0  PHQ - 2 Score 0 2 0  Altered sleeping 0 0   Tired, decreased energy 0 0   Change in appetite 0 0   Feeling bad or failure about yourself  0 0   Trouble concentrating 0 0   Moving slowly or fidgety/restless 0 0   Suicidal thoughts 0 0   PHQ-9 Score 0 2   Difficult doing work/chores Not difficult at all Not difficult at all     Social History   Tobacco Use   Smoking status: Former    Current packs/day: 1.00    Average packs/day: 1 pack/day for 9.0 years (9.0 ttl pk-yrs)    Types: Cigarettes    Passive exposure: Past   Smokeless tobacco: Former  Building services engineer status: Never Used  Substance Use Topics   Alcohol use: Not Currently    Comment: past   Drug use: Not Currently    Types: Marijuana, Cocaine    Comment: past     Review of Systems Per HPI unless specifically indicated above     Objective:    BP 116/70   Pulse 83   Ht 6' (1.829 m)   Wt 217 lb (98.4 kg)   SpO2 97%   BMI 29.43 kg/m   Wt Readings from Last 3 Encounters:  10/26/22 217 lb (98.4 kg)  04/26/22 218  lb 6.4 oz (99.1 kg)  11/03/21 220 lb (99.8 kg)    Physical Exam Vitals and nursing note reviewed.  Constitutional:      General: He is not in acute distress.    Appearance: Normal appearance. He is well-developed. He is not diaphoretic.     Comments: Well-appearing, comfortable, cooperative  HENT:     Head: Normocephalic and atraumatic.  Eyes:     General:        Right eye: No discharge.        Left eye: No discharge.     Conjunctiva/sclera: Conjunctivae normal.  Cardiovascular:     Rate and Rhythm: Normal rate.  Pulmonary:     Effort: Pulmonary effort is normal.  Skin:    General: Skin is warm and dry.     Findings: No erythema or rash.  Neurological:     Mental Status: He is alert and oriented to person, place, and time.  Psychiatric:         Mood and Affect: Mood normal.        Behavior: Behavior normal.        Thought Content: Thought content normal.     Comments: Well groomed, good eye contact, normal speech and thoughts       Results for orders placed or performed in visit on 10/26/22  POCT glycosylated hemoglobin (Hb A1C)  Result Value Ref Range   Hemoglobin A1C 6.0 (A) 4.0 - 5.6 %   HbA1c POC (<> result, manual entry)     HbA1c, POC (prediabetic range)     HbA1c, POC (controlled diabetic range)        Assessment & Plan:   Problem List Items Addressed This Visit     Essential hypertension    Controlled BP, low normal Home BP normal No known complications OFF Amlodipine 5-10mg  due to pedal edema    Plan:  1. Continue Lisinopril 20mg  daily 2. Encourage improved lifestyle - low sodium diet, regular exercise 3. Continue monitor BP outside office, bring readings to next visit, if persistently >140/90 or new symptoms notify office sooner      OSA on CPAP    Well controlled, chronic OSA on CPAP - Good adherence to CPAP nightly - Continue current CPAP therapy, patient seems to be benefiting from therapy       Overweight (BMI 25.0-29.9)   Pre-diabetes - Primary    Improved HbA1c from 6.5 to 6.0 through lifestyle modifications including increased exercise and dietary changes. No medications required. -Continue current lifestyle modifications. -Repeat HbA1c in 6 months.      Relevant Orders   POCT glycosylated hemoglobin (Hb A1C) (Completed)   Other Visit Diagnoses     Need for influenza vaccination           Assessment and Plan    General Health Maintenance -Flu shot administered on 10/13/2022. -Schedule follow-up appointment in April/May 2025 for annual physical.        Orders Placed This Encounter  Procedures   POCT glycosylated hemoglobin (Hb A1C)     No orders of the defined types were placed in this encounter.    Follow up plan: Return in about 6 months (around 04/26/2023) for 6  month fasting lab only then 1 week later Annual Physical (early afternoon preferred).  Future labs ordered for 04/2023   Saralyn Pilar, DO [facility] 10/26/2022, 1:38 PM

## 2022-10-26 NOTE — Patient Instructions (Addendum)
Thank you for coming to the office today.  Recent Labs    04/20/22 0815 10/26/22 1332  HGBA1C 6.5* 6.0*   Keep up the great work!  No change needed at this time. No medications requires.  Repeat in 6 months.  Weight down to 217 lbs Keep working on this.  Continue the CPAP machine.  Try an emotional eating checklist before snacking or binging / eating.  STOP - Think before you eat Am I hungry right now? What am I feeling right now? When is the next planned meal? How will I feel after I eat this? Is there something else I should eat instead?  ------------------  DUE for FASTING BLOOD WORK (no food or drink after midnight before the lab appointment, only water or coffee without cream/sugar on the morning of)  SCHEDULE "Lab Only" visit in the morning at the clinic for lab draw in 6 MONTHS   - Make sure Lab Only appointment is at about 1 week before your next appointment, so that results will be available  For Lab Results, once available within 2-3 days of blood draw, you can can log in to MyChart online to view your results and a brief explanation. Also, we can discuss results at next follow-up visit.    Please schedule a Follow-up Appointment to: Return in about 6 months (around 04/26/2023) for 6 month fasting lab only then 1 week later Annual Physical (early afternoon preferred).  If you have any other questions or concerns, please feel free to call the office or send a message through MyChart. You may also schedule an earlier appointment if necessary.  Additionally, you may be receiving a survey about your experience at our office within a few days to 1 week by e-mail or mail. We value your feedback.  Saralyn Pilar, DO [facility], [place]

## 2022-10-27 ENCOUNTER — Encounter: Payer: Self-pay | Admitting: Family Medicine

## 2022-10-27 ENCOUNTER — Other Ambulatory Visit: Payer: Self-pay | Admitting: Family Medicine

## 2022-10-27 DIAGNOSIS — E663 Overweight: Secondary | ICD-10-CM

## 2022-10-27 DIAGNOSIS — Z0000 Encounter for general adult medical examination without abnormal findings: Secondary | ICD-10-CM

## 2022-10-27 DIAGNOSIS — R351 Nocturia: Secondary | ICD-10-CM

## 2022-10-27 DIAGNOSIS — R7303 Prediabetes: Secondary | ICD-10-CM

## 2022-10-27 DIAGNOSIS — I10 Essential (primary) hypertension: Secondary | ICD-10-CM

## 2022-10-27 DIAGNOSIS — E781 Pure hyperglyceridemia: Secondary | ICD-10-CM

## 2022-10-27 DIAGNOSIS — G4733 Obstructive sleep apnea (adult) (pediatric): Secondary | ICD-10-CM

## 2022-10-27 NOTE — Assessment & Plan Note (Signed)
Controlled BP, low normal ?Home BP normal ?No known complications ?OFF Amlodipine 5-10mg  due to pedal edema ?  ? ?Plan:  ?1. Continue Lisinopril 20mg  daily ?2. Encourage improved lifestyle - low sodium diet, regular exercise ?3. Continue monitor BP outside office, bring readings to next visit, if persistently >140/90 or new symptoms notify office sooner ?

## 2022-10-27 NOTE — Assessment & Plan Note (Signed)
Improved HbA1c from 6.5 to 6.0 through lifestyle modifications including increased exercise and dietary changes. No medications required. -Continue current lifestyle modifications. -Repeat HbA1c in 6 months.

## 2022-10-27 NOTE — Assessment & Plan Note (Signed)
Well controlled, chronic OSA on CPAP - Good adherence to CPAP nightly - Continue current CPAP therapy, patient seems to be benefiting from therapy  

## 2022-12-29 ENCOUNTER — Telehealth: Payer: Self-pay | Admitting: Internal Medicine

## 2022-12-29 NOTE — Telephone Encounter (Signed)
Cpap/bipap cmn signed. Faxed back to [facility]; [phone]. Scanned-Toni

## 2023-03-21 ENCOUNTER — Ambulatory Visit: Payer: Self-pay | Admitting: Family Medicine

## 2023-03-21 ENCOUNTER — Encounter: Payer: Self-pay | Admitting: Family Medicine

## 2023-03-21 VITALS — BP 132/74 | HR 90 | Ht 72.0 in | Wt 218.0 lb

## 2023-03-21 DIAGNOSIS — H6123 Impacted cerumen, bilateral: Secondary | ICD-10-CM | POA: Diagnosis not present

## 2023-03-21 NOTE — Patient Instructions (Addendum)
Ear Irrigation Ear irrigation is a procedure to wash dirt and wax out of your ear canal. It's also called lavage. You may need this if you're having trouble hearing because of wax in your ear. You may also have it done as part of the treatment for an ear infection.  Getting wax and dirt out of your ear can help ear drops work better. Tell a health care provider about: Any allergies you have. All medicines you're taking, including vitamins, herbs, eye drops, creams, and over-the-counter medicines. Any problems you or family members have had with anesthesia. Any bleeding problems you have. Any surgeries you've had. This includes any ear surgeries. Any medical conditions you have, such as any problems with your ear. Whether you're pregnant or may be pregnant. What are the risks? Your health care provider will talk with you about risks. These may include: Infection. Pain. Loss of hearing. Fluid and debris being pushed into your middle ear. This can happen if there are holes in your eardrum. The procedure not working. Trauma to your ear. Feeling dizzy, light-headed, or nauseous. What happens before the procedure? You'll talk with your provider about the procedure and plan. You may be given ear drops to put in your ear 15-20 minutes before the procedure. This helps loosen the wax. What happens during the procedure?  A syringe will be filled with water or a saline solution. Saline is made of salt and water. The syringe will be gently put into your ear. The fluid will be used to wash out wax and other debris. The procedure may vary among providers and hospitals. What can I expect after the procedure? Follow the instructions given to you by your provider. Follow these instructions at home: Using ear irrigation kits In some cases, you can use an ear irrigation kit at home. Ask your provider if this is an option for you. Use the kit only as told by your provider. Read the instructions on the  package. Follow the directions for using the syringe. Use water that's at room temperature. Do not use an ear irrigation kit if: You have diabetes. This can make you more likely to get an infection. You have a hole or tear in your eardrum. You have tubes in your ears. You've had ear surgery before. You've been told not to irrigate your ears. Cleaning your ears  Clean the outside of your ear with a soft washcloth each day. If told by your provider, use a few drops of baby oil, mineral oil, glycerin, hydrogen peroxide, or earwax softening drops. Do not use cotton swabs to clean your ears. These can push wax down into the ear canal. Do not put things into your ears to try to get rid of wax. This includes ear candles. General instructions Take over-the-counter and prescription medicines only as told by your provider. If you were prescribed antibiotics, use them as told by your provider. Do not stop using the antibiotic even if you start to feel better. Keep your ear clean and dry as told by your provider. See your provider at least once a year to have your ears and hearing checked. Contact a health care provider if: Your hearing isn't getting better. Your hearing is getting worse. You have pain or redness in your ear. You feel dizzy. You have ringing in your ears. You have nausea or vomiting. This information is not intended to replace advice given to you by your health care provider. Make sure you discuss any questions you have with  your health care provider. Document Revised: 03/17/2022 Document Reviewed: 03/17/2022 Elsevier Patient Education  2024 Elsevier Inc.   Please schedule a Follow-up Appointment to: No follow-ups on file.  If you have any other questions or concerns, please feel free to call the office or send a message through MyChart. You may also schedule an earlier appointment if necessary.  Additionally, you may be receiving a survey about your experience at our office  within a few days to 1 week by e-mail or mail. We value your feedback.  Saralyn Pilar, DO [facility], [place]

## 2023-03-21 NOTE — Progress Notes (Signed)
 Subjective:    Patient ID: Kyle Buchanan, male    DOB: 1960/12/26, 63 y.o.   MRN: [id number]  Kyle Buchanan is a 63 y.o. male presenting on 03/21/2023 for Cerumen Impaction   HPI  Discussed the use of AI scribe software for clinical note transcription with the patient, who gave verbal consent to proceed.  History of Present Illness   Kyle Buchanan is a 63 year old male who presents with left ear discomfort and hearing issues.  He has been experiencing discomfort in his left ear for the past four days, described as a combination of pain and pressure. His wife noticed that he seemed not to hear him at times, suggesting that he might need his ears cleaned. He has had his ears cleaned in the past, which may have contributed to the current symptoms.  He mentions experiencing some sinus symptoms, including a mildly irritated throat. He is unsure about other symptoms such as cough, congestion, or rhinorrhea. He took Tylenol Sinus over the weekend, which provided some relief.        04/26/2022    1:28 PM 07/21/2020   11:17 AM 09/13/2019    9:10 AM  Depression screen PHQ 2/9  Decreased Interest 0 2 0  Down, Depressed, Hopeless 0 0 0  PHQ - 2 Score 0 2 0  Altered sleeping 0 0   Tired, decreased energy 0 0   Change in appetite 0 0   Feeling bad or failure about yourself  0 0   Trouble concentrating 0 0   Moving slowly or fidgety/restless 0 0   Suicidal thoughts 0 0   PHQ-9 Score 0 2   Difficult doing work/chores Not difficult at all Not difficult at all        04/26/2022    1:29 PM 07/21/2020   11:17 AM  GAD 7 : Generalized Anxiety Score  Nervous, Anxious, on Edge 0 0  Control/stop worrying 0 0  Worry too much - different things 0 0  Trouble relaxing 0 0  Restless 0 0  Easily annoyed or irritable 0 0  Afraid - awful might happen 0 0  Total GAD 7 Score 0 0  Anxiety Difficulty  Not difficult at all    Social History   Tobacco Use   Smoking status:  Former    Current packs/day: 1.00    Average packs/day: 1 pack/day for 9.0 years (9.0 ttl pk-yrs)    Types: Cigarettes    Passive exposure: Past   Smokeless tobacco: Former  Building services engineer status: Never Used  Substance Use Topics   Alcohol use: Not Currently    Comment: past   Drug use: Not Currently    Types: Marijuana, Cocaine    Comment: past     Review of Systems Per HPI unless specifically indicated above     Objective:    BP 132/74   Pulse 90   Ht 6' (1.829 m)   Wt 218 lb (98.9 kg)   SpO2 99%   BMI 29.57 kg/m   Wt Readings from Last 3 Encounters:  03/21/23 218 lb (98.9 kg)  10/26/22 217 lb (98.4 kg)  04/26/22 218 lb 6.4 oz (99.1 kg)    Physical Exam Vitals and nursing note reviewed.  Constitutional:      General: He is not in acute distress.    Appearance: Normal appearance. He is well-developed. He is not diaphoretic.     Comments: Well-appearing, comfortable,  cooperative  HENT:     Head: Normocephalic and atraumatic.     Right Ear: There is impacted cerumen.     Left Ear: There is impacted cerumen.  Eyes:     General:        Right eye: No discharge.        Left eye: No discharge.     Conjunctiva/sclera: Conjunctivae normal.  Cardiovascular:     Rate and Rhythm: Normal rate.  Pulmonary:     Effort: Pulmonary effort is normal.  Skin:    General: Skin is warm and dry.     Findings: No erythema or rash.  Neurological:     Mental Status: He is alert and oriented to person, place, and time.  Psychiatric:        Mood and Affect: Mood normal.        Behavior: Behavior normal.        Thought Content: Thought content normal.     Comments: Well groomed, good eye contact, normal speech and thoughts    Ear Cerumen Removal  Date/Time: 03/21/2023 9:56 AM  Performed by: Smitty Cords, DO Authorized by: Smitty Cords, DO   Anesthesia: Local Anesthetic: none Ceruminolytics applied: Ceruminolytics applied prior to the  procedure. Location details: left ear and right ear Patient tolerance: patient tolerated the procedure well with no immediate complications Procedure type: irrigation  Sedation: Patient sedated: no       Results for orders placed or performed in visit on 10/26/22  POCT glycosylated hemoglobin (Hb A1C)   Collection Time: 10/26/22  1:32 PM  Result Value Ref Range   Hemoglobin A1C 6.0 (A) 4.0 - 5.6 %   HbA1c POC (<> result, manual entry)     HbA1c, POC (prediabetic range)     HbA1c, POC (controlled diabetic range)        Assessment & Plan:   Problem List Items Addressed This Visit   None Visit Diagnoses       Bilateral impacted cerumen    -  Primary         Cerumen Impaction Bilateral ear wax impaction causing discomfort and hearing loss. Wax successfully removed from both ears. -No further action required at this time. -Advised patient to return if symptoms persist or worsen.    Orders Placed This Encounter  Procedures   Ear Cerumen Removal    This order was created via procedure documentation    No orders of the defined types were placed in this encounter.   Follow up plan: Return if symptoms worsen or fail to improve.    Saralyn Pilar, DO [facility] 03/21/2023, 9:55 AM

## 2023-04-26 ENCOUNTER — Other Ambulatory Visit: Payer: Self-pay

## 2023-04-26 ENCOUNTER — Telehealth: Payer: Self-pay

## 2023-04-26 DIAGNOSIS — G4733 Obstructive sleep apnea (adult) (pediatric): Secondary | ICD-10-CM

## 2023-04-26 DIAGNOSIS — Z0000 Encounter for general adult medical examination without abnormal findings: Secondary | ICD-10-CM

## 2023-04-26 DIAGNOSIS — E781 Pure hyperglyceridemia: Secondary | ICD-10-CM

## 2023-04-26 DIAGNOSIS — I10 Essential (primary) hypertension: Secondary | ICD-10-CM

## 2023-04-26 DIAGNOSIS — E663 Overweight: Secondary | ICD-10-CM

## 2023-04-26 DIAGNOSIS — R351 Nocturia: Secondary | ICD-10-CM

## 2023-04-26 DIAGNOSIS — R7303 Prediabetes: Secondary | ICD-10-CM

## 2023-04-26 DIAGNOSIS — N522 Drug-induced erectile dysfunction: Secondary | ICD-10-CM

## 2023-04-26 MED ORDER — SILDENAFIL CITRATE 20 MG PO TABS: ORAL_TABLET | ORAL | 5 refills | Status: DC

## 2023-04-26 NOTE — Telephone Encounter (Signed)
 Kyle Buchanan (Key: [id number]) PA Case ID #: [id number] Rx #: [id number] Need Help? Call us at [phone] Status Additional Information Required Drug Sildenafil Citrate (PAH) 20MG  tablets ePA cloud Psychologist, educational Electronic PA Form [phone] NCPDP) Original Claim Info R6,75 SPECIALTY DRUG: [facility] CALL [phone]/SERVICE NOT APPROPRIATE FOR THISLOCATIONDRUG REQUIRES PRIOR AUTHORIZATION

## 2023-04-27 ENCOUNTER — Other Ambulatory Visit: Payer: Self-pay | Admitting: Family Medicine

## 2023-04-27 ENCOUNTER — Encounter: Payer: Self-pay | Admitting: Family Medicine

## 2023-04-27 DIAGNOSIS — N522 Drug-induced erectile dysfunction: Secondary | ICD-10-CM

## 2023-04-27 LAB — CBC WITH DIFFERENTIAL/PLATELET
Absolute Lymphocytes: 1283 {cells}/uL (ref 850–3900)
Absolute Monocytes: 374 {cells}/uL (ref 200–950)
Basophils Absolute: 38 {cells}/uL (ref 0–200)
Basophils Relative: 1.2 %
Eosinophils Absolute: 346 {cells}/uL (ref 15–500)
Eosinophils Relative: 10.8 %
HCT: 44 % (ref 38.5–50.0)
Hemoglobin: 14.2 g/dL (ref 13.2–17.1)
MCH: 26.8 pg — ABNORMAL LOW (ref 27.0–33.0)
MCHC: 32.3 g/dL (ref 32.0–36.0)
MCV: 83.2 fL (ref 80.0–100.0)
MPV: 8.7 fL (ref 7.5–12.5)
Monocytes Relative: 11.7 %
Neutro Abs: 1158 {cells}/uL — ABNORMAL LOW (ref 1500–7800)
Neutrophils Relative %: 36.2 %
Platelets: 222 10*3/uL (ref 140–400)
RBC: 5.29 10*6/uL (ref 4.20–5.80)
RDW: 13 % (ref 11.0–15.0)
Total Lymphocyte: 40.1 %
WBC: 3.2 10*3/uL — ABNORMAL LOW (ref 3.8–10.8)

## 2023-04-27 LAB — COMPLETE METABOLIC PANEL WITHOUT GFR
AG Ratio: 1.8 (calc) (ref 1.0–2.5)
ALT: 23 U/L (ref 9–46)
AST: 25 U/L (ref 10–35)
Albumin: 4.3 g/dL (ref 3.6–5.1)
Alkaline phosphatase (APISO): 56 U/L (ref 35–144)
BUN: 20 mg/dL (ref 7–25)
CO2: 29 mmol/L (ref 20–32)
Calcium: 9.3 mg/dL (ref 8.6–10.3)
Chloride: 102 mmol/L (ref 98–110)
Creat: 1 mg/dL (ref 0.70–1.35)
Globulin: 2.4 g/dL (ref 1.9–3.7)
Glucose, Bld: 122 mg/dL (ref 65–139)
Potassium: 4.5 mmol/L (ref 3.5–5.3)
Sodium: 137 mmol/L (ref 135–146)
Total Bilirubin: 0.4 mg/dL (ref 0.2–1.2)
Total Protein: 6.7 g/dL (ref 6.1–8.1)

## 2023-04-27 LAB — HEMOGLOBIN A1C
Hgb A1c MFr Bld: 6.5 %{Hb} — ABNORMAL HIGH (ref <5.7)
Mean Plasma Glucose: 140 mg/dL
eAG (mmol/L): 7.7 mmol/L

## 2023-04-27 LAB — LIPID PANEL
Cholesterol: 146 mg/dL (ref <200)
HDL: 63 mg/dL (ref >=40)
LDL Cholesterol (Calc): 61 mg/dL
Non-HDL Cholesterol (Calc): 83 mg/dL (ref <130)
Total CHOL/HDL Ratio: 2.3 (calc) (ref <5.0)
Triglycerides: 140 mg/dL (ref <150)

## 2023-04-27 LAB — TSH: TSH: 0.63 m[IU]/L (ref 0.40–4.50)

## 2023-04-27 LAB — PSA: PSA: 0.48 ng/mL (ref <=4.00)

## 2023-04-28 NOTE — Telephone Encounter (Signed)
 Too soon for refill. Last refill  04/26/23.E-Prescribing Status: Receipt confirmed by pharmacy (04/26/2023  8:21 AM EDT).  Requested Prescriptions  Pending Prescriptions Disp Refills   sildenafil (REVATIO) 20 MG tablet [Pharmacy Med Name: SILDENAFIL 20 MG TABLET] 60 tablet 5    Sig: TAKE 1-5 TABS ABOUT 30 MINS PRIOR TO SEX, START WITH 1 AND INCREASE AS NEEDED     Urology: Erectile Dysfunction Agents Failed - 04/28/2023  1:06 PM      Failed - Valid encounter within last 12 months    Recent Outpatient Visits           1 month ago Bilateral impacted cerumen   [facility] [place], Netta Neat, DO              Passed - AST in normal range and within 360 days    AST  Date Value Ref Range Status  04/26/2023 25 10 - 35 U/L Final         Passed - ALT in normal range and within 360 days    ALT  Date Value Ref Range Status  04/26/2023 23 9 - 46 U/L Final         Passed - Last BP in normal range    BP Readings from Last 1 Encounters:  03/21/23 132/74

## 2023-05-08 ENCOUNTER — Encounter: Payer: Self-pay | Admitting: Family Medicine

## 2023-05-08 ENCOUNTER — Ambulatory Visit (INDEPENDENT_AMBULATORY_CARE_PROVIDER_SITE_OTHER): Admitting: Family Medicine

## 2023-05-08 VITALS — BP 120/78 | HR 73 | Ht 72.0 in | Wt 220.1 lb

## 2023-05-08 DIAGNOSIS — I10 Essential (primary) hypertension: Secondary | ICD-10-CM

## 2023-05-08 DIAGNOSIS — N522 Drug-induced erectile dysfunction: Secondary | ICD-10-CM

## 2023-05-08 DIAGNOSIS — Z0000 Encounter for general adult medical examination without abnormal findings: Secondary | ICD-10-CM | POA: Diagnosis not present

## 2023-05-08 MED ORDER — SILDENAFIL CITRATE 20 MG PO TABS
ORAL_TABLET | ORAL | 5 refills | Status: DC
Start: 2023-05-08 — End: 2023-07-19

## 2023-05-08 MED ORDER — LISINOPRIL 20 MG PO TABS: 20.0000 mg | ORAL_TABLET | Freq: Every day | ORAL | 3 refills | Status: DC

## 2023-05-08 NOTE — Progress Notes (Signed)
 Subjective:    Patient ID: Kyle Buchanan, male    DOB: 1960-03-30, 63 y.o.   MRN: [id number]  Kyle Buchanan is a 63 y.o. male presenting on 05/08/2023 for Annual Exam   HPI  Discussed the use of AI scribe software for clinical note transcription with the patient, who gave verbal consent to proceed.  History of Present Illness   Kyle Buchanan is a 63 year old male who presents for follow-up of elevated blood sugar levels.       Pre-Diabetes / Overweight BMI >29 A1c 6.5 elevated again, prior 6.0 to 6.5 range. Less adherence to diet, now eating donuts He is exercising regularly and doing cardio and strength training. Meds: Never on med Currently on ACEi Lifestyle: Weight 217 to 220 lbs in past 6 months - Diet (goal to limit carb) - Exercise (He does walking and exercising back on bike) Denies hypoglycemia, polyuria, visual changes, numbness or tingling.   OSA, on CPAP - Patient reports prior history of dx OSA and on CPAP - Today reports that sleep apnea is well controlled. He uses the CPAP machine every night. Tolerates the machine well, and thinks that sleeps better with it and feels good. No new concerns or symptoms.   CHRONIC HTN See above lifestyle Today he is doing well, BP is controlled Taking Lisinopril  20mg  daily Reports good compliance, took meds today. Tolerating well, w/o complaints. Denies CP, dyspnea, HA, edema, dizziness / lightheadedness   HYPERLIPIDEMIA: - Reports no concerns. Last lipid panel 04/2022 with hyperTG other normal LDL result Controlled on diet and lifestyle   Erectile Dysfunction Improved Needs re order not covered by his insurance, and he is exploring cost-saving options through discount programs.     Health Maintenance:     Colonoscopy last done 04/10/19, next due 7 years 2028   Future Prevnar-20 at age 44  PSA 0.48 (04/2023) negative.      05/08/2023    9:23 AM 04/26/2022    1:28 PM 07/21/2020   11:17 AM   Depression screen PHQ 2/9  Decreased Interest 0 0 2  Down, Depressed, Hopeless 0 0 0  PHQ - 2 Score 0 0 2  Altered sleeping  0 0  Tired, decreased energy 1 0 0  Change in appetite  0 0  Feeling bad or failure about yourself  0 0 0  Trouble concentrating 0 0 0  Moving slowly or fidgety/restless 0 0 0  Suicidal thoughts 0 0 0  PHQ-9 Score  0 2  Difficult doing work/chores Not difficult at all Not difficult at all Not difficult at all       05/08/2023    9:23 AM 04/26/2022    1:29 PM 07/21/2020   11:17 AM  GAD 7 : Generalized Anxiety Score  Nervous, Anxious, on Edge 0 0 0  Control/stop worrying 0 0 0  Worry too much - different things 0 0 0  Trouble relaxing 0 0 0  Restless 0 0 0  Easily annoyed or irritable 0 0 0  Afraid - awful might happen 0 0 0  Total GAD 7 Score 0 0 0  Anxiety Difficulty Not difficult at all  Not difficult at all     Past Medical History:  Diagnosis Date   Hepatitis C    Past Surgical History:  Procedure Laterality Date   COLONOSCOPY WITH PROPOFOL  N/A 04/10/2019   Procedure: COLONOSCOPY WITH PROPOFOL ;  Surgeon: Selena Daily, MD;  Location: [facility]  ENDOSCOPY;  Service: Gastroenterology;  Laterality: N/A;  PRIORITY 4   Social History   Socioeconomic History   Marital status: Married    Spouse name: Not on file   Number of children: Not on file   Years of education: Not on file   Highest education level: Not on file  Occupational History   Not on file  Tobacco Use   Smoking status: Former    Current packs/day: 1.00    Average packs/day: 1 pack/day for 9.0 years (9.0 ttl pk-yrs)    Types: Cigarettes    Passive exposure: Past   Smokeless tobacco: Former  Building services engineer status: Never Used  Substance and Sexual Activity   Alcohol use: Not Currently    Comment: past   Drug use: Not Currently    Types: Marijuana, Cocaine    Comment: past    Sexual activity: Yes  Other Topics Concern   Not on file  Social History Narrative   Not on  file   Social Drivers of Health   Financial Resource Strain: Not on file  Food Insecurity: Not on file  Transportation Needs: Not on file  Physical Activity: Not on file  Stress: Not on file  Social Connections: Not on file  Intimate Partner Violence: Not on file   Family History  Problem Relation Age of Onset   Cancer Father        bone cancer   Cancer Sister        uncertain type   Current Outpatient Medications on File Prior to Visit  Medication Sig   Multiple Vitamin (MULTI-VITAMINS) TABS Take by mouth.   No current facility-administered medications on file prior to visit.    Review of Systems  Constitutional:  Negative for activity change, appetite change, chills, diaphoresis, fatigue and fever.  HENT:  Negative for congestion and hearing loss.   Eyes:  Negative for visual disturbance.  Respiratory:  Negative for cough, chest tightness, shortness of breath and wheezing.   Cardiovascular:  Negative for chest pain, palpitations and leg swelling.  Gastrointestinal:  Negative for abdominal pain, constipation, diarrhea, nausea and vomiting.  Genitourinary:  Negative for dysuria, frequency and hematuria.  Musculoskeletal:  Negative for arthralgias and neck pain.  Skin:  Negative for rash.  Neurological:  Negative for dizziness, weakness, light-headedness, numbness and headaches.  Hematological:  Negative for adenopathy.  Psychiatric/Behavioral:  Negative for behavioral problems, dysphoric mood and sleep disturbance.    Per HPI unless specifically indicated above     Objective:    BP 120/78 (BP Location: Left Arm, Patient Position: Sitting, Cuff Size: Normal)   Pulse 73   Ht 6' (1.829 m)   Wt 220 lb 2 oz (99.8 kg)   SpO2 94%   BMI 29.85 kg/m   Wt Readings from Last 3 Encounters:  05/08/23 220 lb 2 oz (99.8 kg)  03/21/23 218 lb (98.9 kg)  10/26/22 217 lb (98.4 kg)    Physical Exam Vitals and nursing note reviewed.  Constitutional:      General: He is not in  acute distress.    Appearance: He is well-developed. He is not diaphoretic.     Comments: Well-appearing, comfortable, cooperative  HENT:     Head: Normocephalic and atraumatic.  Eyes:     General:        Right eye: No discharge.        Left eye: No discharge.     Conjunctiva/sclera: Conjunctivae normal.     Pupils: Pupils  are equal, round, and reactive to light.  Neck:     Thyroid: No thyromegaly.     Vascular: No carotid bruit.  Cardiovascular:     Rate and Rhythm: Normal rate and regular rhythm.     Pulses: Normal pulses.     Heart sounds: Normal heart sounds. No murmur heard. Pulmonary:     Effort: Pulmonary effort is normal. No respiratory distress.     Breath sounds: Normal breath sounds. No wheezing or rales.  Abdominal:     General: Bowel sounds are normal. There is no distension.     Palpations: Abdomen is soft. There is no mass.     Tenderness: There is no abdominal tenderness.  Musculoskeletal:        General: No tenderness. Normal range of motion.     Cervical back: Normal range of motion and neck supple.     Right lower leg: No edema.     Left lower leg: No edema.     Comments: Upper / Lower Extremities: - Normal muscle tone, strength bilateral upper extremities 5/5, lower extremities 5/5  Lymphadenopathy:     Cervical: No cervical adenopathy.  Skin:    General: Skin is warm and dry.     Findings: No erythema or rash.  Neurological:     Mental Status: He is alert and oriented to person, place, and time.     Comments: Distal sensation intact to light touch all extremities  Psychiatric:        Mood and Affect: Mood normal.        Behavior: Behavior normal.        Thought Content: Thought content normal.     Comments: Well groomed, good eye contact, normal speech and thoughts     Results for orders placed or performed in visit on 04/26/23  TSH   Collection Time: 04/26/23  8:08 AM  Result Value Ref Range   TSH 0.63 0.40 - 4.50 mIU/L  PSA   Collection  Time: 04/26/23  8:08 AM  Result Value Ref Range   PSA 0.48 < OR = 4.00 ng/mL  CBC with Differential/Platelet   Collection Time: 04/26/23  8:08 AM  Result Value Ref Range   WBC 3.2 (L) 3.8 - 10.8 Thousand/uL   RBC 5.29 4.20 - 5.80 Million/uL   Hemoglobin 14.2 13.2 - 17.1 g/dL   HCT 16.1 09.6 - 04.5 %   MCV 83.2 80.0 - 100.0 fL   MCH 26.8 (L) 27.0 - 33.0 pg   MCHC 32.3 32.0 - 36.0 g/dL   RDW 40.9 81.1 - 91.4 %   Platelets 222 140 - 400 Thousand/uL   MPV 8.7 7.5 - 12.5 fL   Neutro Abs 1,158 (L) 1,500 - 7,800 cells/uL   Absolute Lymphocytes 1,283 850 - 3,900 cells/uL   Absolute Monocytes 374 200 - 950 cells/uL   Eosinophils Absolute 346 15 - 500 cells/uL   Basophils Absolute 38 0 - 200 cells/uL   Neutrophils Relative % 36.2 %   Total Lymphocyte 40.1 %   Monocytes Relative 11.7 %   Eosinophils Relative 10.8 %   Basophils Relative 1.2 %  COMPLETE METABOLIC PANEL WITH GFR   Collection Time: 04/26/23  8:08 AM  Result Value Ref Range   Glucose, Bld 122 65 - 139 mg/dL   BUN 20 7 - 25 mg/dL   Creat 7.82 9.56 - 2.13 mg/dL   BUN/Creatinine Ratio SEE NOTE: 6 - 22 (calc)   Sodium 137 135 - 146 mmol/L  Potassium 4.5 3.5 - 5.3 mmol/L   Chloride 102 98 - 110 mmol/L   CO2 29 20 - 32 mmol/L   Calcium 9.3 8.6 - 10.3 mg/dL   Total Protein 6.7 6.1 - 8.1 g/dL   Albumin 4.3 3.6 - 5.1 g/dL   Globulin 2.4 1.9 - 3.7 g/dL (calc)   AG Ratio 1.8 1.0 - 2.5 (calc)   Total Bilirubin 0.4 0.2 - 1.2 mg/dL   Alkaline phosphatase (APISO) 56 35 - 144 U/L   AST 25 10 - 35 U/L   ALT 23 9 - 46 U/L  Lipid panel   Collection Time: 04/26/23  8:08 AM  Result Value Ref Range   Cholesterol 146 <200 mg/dL   HDL 63 > OR = 40 mg/dL   Triglycerides 569 <794 mg/dL   LDL Cholesterol (Calc) 61 mg/dL (calc)   Total CHOL/HDL Ratio 2.3 <5.0 (calc)   Non-HDL Cholesterol (Calc) 83 <801 mg/dL (calc)  Hemoglobin K5V   Collection Time: 04/26/23  8:08 AM  Result Value Ref Range   Hgb A1c MFr Bld 6.5 (H) <5.7 % of total  Hgb   Mean Plasma Glucose 140 mg/dL   eAG (mmol/L) 7.7 mmol/L      Assessment & Plan:   Problem List Items Addressed This Visit     Essential hypertension   Relevant Medications   lisinopril  (ZESTRIL ) 20 MG tablet   sildenafil  (REVATIO ) 20 MG tablet   Other Visit Diagnoses       Annual physical exam    -  Primary     Drug-induced erectile dysfunction       Relevant Medications   sildenafil  (REVATIO ) 20 MG tablet        Updated Health Maintenance information Reviewed recent lab results with patient Encouraged improvement to lifestyle with diet and exercise Goal of weight loss   Pre-diabetes Blood sugar level at 6.5 indicates pre-diabetes with risk of progression due to family history. Emphasized lifestyle modifications to prevent progression. - Advise reducing intake of donuts, excess sweets, carbs, and starches. - Encourage increased physical activity. - Schedule follow-up blood sugar check in six months.  Hypertension Currently on antihypertensive medication with prescription refill needed. - Ensure blood pressure medication Lisinopril  20mg  daily prescription is updated.  Erectile Dysfunction Using sildenafil , not covered by insurance. Suggested cost-saving measures. - Advise using GoodRx app for sildenafil  discounts. - Cancel current sildenafil  order at [facility] and switch to [facility].  General Health Maintenance Up to date with most screenings and vaccinations. Discussed optional vaccines and heart scan. - Consider shingles vaccine at the pharmacy. - Plan for pneumonia vaccine at age 65. - Schedule colonoscopy in 2028. - Discuss heart scan for future consideration.  Follow-up Prefers follow-up appointments on Tuesday mornings. - Schedule follow-up appointment for Tuesday, October 21st. - Print appointment details for him.         No orders of the defined types were placed in this encounter.   Meds ordered this encounter  Medications   lisinopril   (ZESTRIL ) 20 MG tablet    Sig: Take 1 tablet (20 mg total) by mouth daily.    Dispense:  90 tablet    Refill:  3    Add future refills   sildenafil  (REVATIO ) 20 MG tablet    Sig: TAKE 1-5 TABS ABOUT 30 MIN PRIOR TO SEX. START WITH 1 AND INCREASE AS NEEDED.    Dispense:  30 tablet    Refill:  5     Follow up plan: Return in  about 6 months (around 11/07/2023) for 6 month PreDM A1c.  Domingo Friend, DO [facility] 05/08/2023, 9:36 AM

## 2023-05-08 NOTE — Patient Instructions (Addendum)
 Thank you for coming to the office today.  Sildenafil  sent to [facility].  Recent Labs    10/26/22 1332 04/26/23 0808  HGBA1C 6.0* 6.5*   Elevated blood sugar again, goal to limit donuts and excess sweets carb starches.   Diet Recommendations for  Preventing Diabetes   REDUCE Starchy (carb) foods include: Bread, rice, pasta, potatoes, corn, crackers, bagels, muffins, all baked goods.   FRUITS - LIMIT these HIGH sugar/carb fruits = Pineapple, Watermelon, Bananas - OKAY with these MEDIUM sugar/carb fruits = Citrus, Oranges, Grapes - PREFER these LOW sugar/carb fruits = Apples, Berries, Pears, Plums  Protein foods include: Meat, fish, poultry, eggs, dairy foods, and beans such as pinto and kidney beans (beans also provide carbohydrate).   1. Eat at least 3 meals and 1-2 snacks per day. Never go more than 4-5 hours while awake without eating.   2. Limit starchy foods to TWO per meal and ONE per snack. ONE portion of a starchy  food is equal to the following:   - ONE slice of bread (or its equivalent, such as half of a hamburger bun).   - 1/2 cup of a "scoopable" starchy food such as potatoes or rice.   - 1 OUNCE (28 grams) of starchy snacks (crackers or pretzels, look on label).   - 15 grams of carbohydrate as shown on food label.   3. Both lunch and dinner should include a protein food, a carb food, and vegetables.   - Obtain twice as many veg's as protein or carbohydrate foods for both lunch and dinner.   - Try to keep frozen veg's on hand for a quick vegetable serving.     - Fresh or frozen veg's are best.   4. Breakfast should always include protein.     FUTURE Coronary Calcium Score Cardiac CT Scan. This is a screening test for patients aged 12-50+ with cardiovascular risk factors or who are healthy but would be interested in Cardiovascular Screening for heart disease. Even if there is a family history of heart disease, this imaging can be useful. Typically it can be done  every 5+ years or at a different timeline we agree on  The scan will look at the chest and mainly focus on the heart and identify early signs of calcium build up or blockages within the heart arteries. It is not 100% accurate for identifying blockages or heart disease, but it is useful to help us  predict who may have some early changes or be at risk in the future for a heart attack or cardiovascular problem.  The results are reviewed by a Cardiologist and they will document the results. It should become available on MyChart. Typically the results are divided into percentiles based on other patients of the same demographic and age. So it will compare your risk to others similar to you. If you have a higher score >99 or higher percentile >75%tile, it is recommended to consider Statin cholesterol therapy and or referral to Cardiologist. I will try to help explain your results and if we have questions we can contact the Cardiologist.  You will be contacted for scheduling. Usually it is done at any imaging facility through [facility], [facility] or [facility].  The cost is $99 flat fee total and it does not go through insurance, so no authorization is required.    Please schedule a Follow-up Appointment to: Return in about 6 months (around 11/07/2023) for 6 month PreDM A1c.  If you have  any other questions or concerns, please feel free to call the office or send a message through MyChart. You may also schedule an earlier appointment if necessary.  Additionally, you may be receiving a survey about your experience at our office within a few days to 1 week by e-mail or mail. We value your feedback.  Domingo Friend, DO [facility], [place]

## 2023-07-04 ENCOUNTER — Ambulatory Visit: Payer: Self-pay

## 2023-07-04 NOTE — Telephone Encounter (Signed)
 Please call patient back with advice before his apt 7/2  He asked us  if his BP med is too strong causing nose bleeds. I don't believe that is the cause of his bleeding, but if his BP readings remain normal, he may try half dose Lisinopril  from 20mg  down to 10mg  (one half tab daily) he can see if Home BP readings remain controlled at this dose and we can follow up at next visit.  Also I think the most likely answer for the nose bleeds would be drying of nasal mucosa due to CPAP use.  The air from CPAP can dry the nose tissue and cause bleeding.  Highly recommended to use nasal saline frequently and even small amount of water based lubricant. He should AVOID vaseline or oil based lubricant for nose for this problem.  Domingo Friend, DO [facility] 07/04/2023, 7:26 PM

## 2023-07-04 NOTE — Telephone Encounter (Signed)
 FYI Only or Action Required?: FYI only for provider  Patient was last seen in primary care on 05/08/2023 by Raina Bunting, DO. Called Nurse Triage reporting Epistaxis. Symptoms began several months ago. Interventions attempted: Nothing. Symptoms are: stable.  Triage Disposition: See PCP Within 2 Weeks; apt scheduled for first available, July 2 at 3 pm.   Patient/caregiver understands and will follow disposition?: Yes Copied from CRM [id number]. Topic: Clinical - Medical Advice >> Jul 04, 2023 11:09 AM Lysbeth Sauger wrote: Reason for CRM: Pt has been having nosebleeds he also thinks his high blood pressure medication is to strong for him.appt isnt available until July 2nd could you please give pt a callback? Reason for Disposition  Hard-to-stop nosebleeds are a chronic symptom (recurrent or ongoing AND present > 4 weeks)  Answer Assessment - Initial Assessment Questions 1. AMOUNT OF BLEEDING: How bad is the bleeding? How much blood was lost? Has the bleeding stopped?   - MILD: needed a couple tissues   - MODERATE: needed many tissues   - SEVERE: large blood clots, soaked many tissues, lasted more than 30 minutes      States using cpap machine and has been getting nose bleeds 2. ONSET: When did the nosebleed start?      At least 6 months 3. FREQUENCY: How many nosebleeds have you had in the last 24 hours?      10 times in 6 months 4. RECURRENT SYMPTOMS: Have there been other recent nosebleeds? If Yes, ask: How long did it take you to stop the bleeding? What worked best?      Yes, 10 min. 5. CAUSE: What do you think caused this nosebleed?     cpap 6. LOCAL FACTORS: Do you have any cold symptoms?, Have you been rubbing or picking at your nose?     no 7. SYSTEMIC FACTORS: Do you have high blood pressure or any bleeding problems?     yes 8. BLOOD THINNERS: Do you take any blood thinners? (e.g., aspirin, clopidogrel / Plavix, coumadin, heparin). Notes: Other  strong blood thinners include: Arixtra (fondaparinux), Eliquis (apixaban), Pradaxa (dabigatran), and Xarelto (rivaroxaban).     denies 9. OTHER SYMPTOMS: Do you have any other symptoms? (e.g., lightheadedness)     Dizziness, lightheadedness 10. PREGNANCY: Is there any chance you are pregnant? When was your last menstrual period?       na  Protocols used: Nosebleed-A-AH

## 2023-07-05 NOTE — Telephone Encounter (Signed)
 Patient notified to take 10 mg of Lisinopril  and monitor BP readings. Also to use nasal saline and avoid using vaseline.

## 2023-07-19 ENCOUNTER — Encounter: Payer: Self-pay | Admitting: Family Medicine

## 2023-07-19 ENCOUNTER — Ambulatory Visit: Admitting: Family Medicine

## 2023-07-19 VITALS — BP 122/80 | HR 75 | Ht 72.0 in | Wt 218.1 lb

## 2023-07-19 DIAGNOSIS — R040 Epistaxis: Secondary | ICD-10-CM | POA: Diagnosis not present

## 2023-07-19 DIAGNOSIS — Z23 Encounter for immunization: Secondary | ICD-10-CM | POA: Diagnosis not present

## 2023-07-19 DIAGNOSIS — G4733 Obstructive sleep apnea (adult) (pediatric): Secondary | ICD-10-CM | POA: Diagnosis not present

## 2023-07-19 DIAGNOSIS — I10 Essential (primary) hypertension: Secondary | ICD-10-CM

## 2023-07-19 NOTE — Patient Instructions (Addendum)
 Thank you for coming to the office today.  Reduce Lisinopril  from 20 to 10mg . Take half tab of 20 for now, before run out, 2 weeks contact us  for new order 10mg  lisinopril  lower dose.  Use moisturizing lubricating spray for nasal passage to avoid dry nasal passage and bleeding with CPAP use  Prevnar-20 pneumonia vaccine today.  Please schedule a Follow-up Appointment to: Return if symptoms worsen or fail to improve.  If you have any other questions or concerns, please feel free to call the office or send a message through MyChart. You may also schedule an earlier appointment if necessary.  Additionally, you may be receiving a survey about your experience at our office within a few days to 1 week by e-mail or mail. We value your feedback.  Marsa Officer, DO [facility], [place]

## 2023-07-19 NOTE — Progress Notes (Signed)
 Subjective:    Patient ID: Kyle Buchanan, male    DOB: 1961-01-09, 63 y.o.   MRN: [id number]  [name] is a 63 y.o. male presenting on 07/19/2023 for Epistaxis   HPI  Discussed the use of AI scribe software for clinical note transcription with the patient, who gave verbal consent to proceed.  History of Present Illness   Kyle Buchanan is a 63 year old male who presents with nosebleeds and dizziness.  Epistaxis - Experiences nosebleeds, attributed to CPAP machine use causing nasal dryness - Uses nasal spray to lubricate and keep nasal passages moist - Nasal spray saline has been effective in preventing further episodes of epistaxis  Dizziness - Experiences dizziness - Dizziness has improved at night and now resolved on reduced BP med  Hypertension - Takes lisinopril  for blood pressure management - Initially on 20 mg dose, currently taking 10 mg by splitting a 20 mg tablet - Prefers switching to a 10 mg tablet to avoid splitting doses          05/08/2023    9:23 AM 04/26/2022    1:28 PM 07/21/2020   11:17 AM  Depression screen PHQ 2/9  Decreased Interest 0 0 2  Down, Depressed, Hopeless 0 0 0  PHQ - 2 Score 0 0 2  Altered sleeping  0 0  Tired, decreased energy 1 0 0  Change in appetite  0 0  Feeling bad or failure about yourself  0 0 0  Trouble concentrating 0 0 0  Moving slowly or fidgety/restless 0 0 0  Suicidal thoughts 0 0 0  PHQ-9 Score  0 2  Difficult doing work/chores Not difficult at all Not difficult at all Not difficult at all       05/08/2023    9:23 AM 04/26/2022    1:29 PM 07/21/2020   11:17 AM  GAD 7 : Generalized Anxiety Score  Nervous, Anxious, on Edge 0 0 0  Control/stop worrying 0 0 0  Worry too much - different things 0 0 0  Trouble relaxing 0 0 0  Restless 0 0 0  Easily annoyed or irritable 0 0 0  Afraid - awful might happen 0 0 0  Total GAD 7 Score 0 0 0  Anxiety Difficulty Not difficult at all  Not difficult at  all    Social History   Tobacco Use   Smoking status: Former    Current packs/day: 1.00    Average packs/day: 1 pack/day for 9.0 years (9.0 ttl pk-yrs)    Types: Cigarettes    Passive exposure: Past   Smokeless tobacco: Former  Building services engineer status: Never Used  Substance Use Topics   Alcohol use: Not Currently    Comment: past   Drug use: Not Currently    Types: Marijuana, Cocaine    Comment: past     Review of Systems Per HPI unless specifically indicated above     Objective:    BP 122/80 (BP Location: Right Arm, Patient Position: Sitting, Cuff Size: Normal)   Pulse 75   Ht 6' (1.829 m)   Wt 218 lb 2 oz (98.9 kg)   SpO2 96%   BMI 29.58 kg/m   Wt Readings from Last 3 Encounters:  07/19/23 218 lb 2 oz (98.9 kg)  05/08/23 220 lb 2 oz (99.8 kg)  03/21/23 218 lb (98.9 kg)    Physical Exam Vitals and nursing note reviewed.  Constitutional:  General: He is not in acute distress.    Appearance: Normal appearance. He is well-developed. He is not diaphoretic.     Comments: Well-appearing, comfortable, cooperative  HENT:     Head: Normocephalic and atraumatic.  Eyes:     General:        Right eye: No discharge.        Left eye: No discharge.     Conjunctiva/sclera: Conjunctivae normal.  Cardiovascular:     Rate and Rhythm: Normal rate.  Pulmonary:     Effort: Pulmonary effort is normal.  Skin:    General: Skin is warm and dry.     Findings: No erythema or rash.  Neurological:     Mental Status: He is alert and oriented to person, place, and time.  Psychiatric:        Mood and Affect: Mood normal.        Behavior: Behavior normal.        Thought Content: Thought content normal.     Comments: Well groomed, good eye contact, normal speech and thoughts     Results for orders placed or performed in visit on 04/26/23  TSH   Collection Time: 04/26/23  8:08 AM  Result Value Ref Range   TSH 0.63 0.40 - 4.50 mIU/L  PSA   Collection Time: 04/26/23   8:08 AM  Result Value Ref Range   PSA 0.48 < OR = 4.00 ng/mL  CBC with Differential/Platelet   Collection Time: 04/26/23  8:08 AM  Result Value Ref Range   WBC 3.2 (L) 3.8 - 10.8 Thousand/uL   RBC 5.29 4.20 - 5.80 Million/uL   Hemoglobin 14.2 13.2 - 17.1 g/dL   HCT 55.9 61.4 - 49.9 %   MCV 83.2 80.0 - 100.0 fL   MCH 26.8 (L) 27.0 - 33.0 pg   MCHC 32.3 32.0 - 36.0 g/dL   RDW 86.9 88.9 - 84.9 %   Platelets 222 140 - 400 Thousand/uL   MPV 8.7 7.5 - 12.5 fL   Neutro Abs 1,158 (L) 1,500 - 7,800 cells/uL   Absolute Lymphocytes 1,283 850 - 3,900 cells/uL   Absolute Monocytes 374 200 - 950 cells/uL   Eosinophils Absolute 346 15 - 500 cells/uL   Basophils Absolute 38 0 - 200 cells/uL   Neutrophils Relative % 36.2 %   Total Lymphocyte 40.1 %   Monocytes Relative 11.7 %   Eosinophils Relative 10.8 %   Basophils Relative 1.2 %  COMPLETE METABOLIC PANEL WITH GFR   Collection Time: 04/26/23  8:08 AM  Result Value Ref Range   Glucose, Bld 122 65 - 139 mg/dL   BUN 20 7 - 25 mg/dL   Creat 8.99 9.29 - 8.64 mg/dL   BUN/Creatinine Ratio SEE NOTE: 6 - 22 (calc)   Sodium 137 135 - 146 mmol/L   Potassium 4.5 3.5 - 5.3 mmol/L   Chloride 102 98 - 110 mmol/L   CO2 29 20 - 32 mmol/L   Calcium 9.3 8.6 - 10.3 mg/dL   Total Protein 6.7 6.1 - 8.1 g/dL   Albumin 4.3 3.6 - 5.1 g/dL   Globulin 2.4 1.9 - 3.7 g/dL (calc)   AG Ratio 1.8 1.0 - 2.5 (calc)   Total Bilirubin 0.4 0.2 - 1.2 mg/dL   Alkaline phosphatase (APISO) 56 35 - 144 U/L   AST 25 10 - 35 U/L   ALT 23 9 - 46 U/L  Lipid panel   Collection Time: 04/26/23  8:08 AM  Result Value  Ref Range   Cholesterol 146 <200 mg/dL   HDL 63 > OR = 40 mg/dL   Triglycerides 859 <849 mg/dL   LDL Cholesterol (Calc) 61 mg/dL (calc)   Total CHOL/HDL Ratio 2.3 <5.0 (calc)   Non-HDL Cholesterol (Calc) 83 <869 mg/dL (calc)  Hemoglobin J8r   Collection Time: 04/26/23  8:08 AM  Result Value Ref Range   Hgb A1c MFr Bld 6.5 (H) <5.7 % of total Hgb   Mean Plasma  Glucose 140 mg/dL   eAG (mmol/L) 7.7 mmol/L      Assessment & Plan:   Problem List Items Addressed This Visit     Essential hypertension - Primary   Relevant Medications   lisinopril  (ZESTRIL ) 10 MG tablet   OSA on CPAP   Other Visit Diagnoses       Need for Streptococcus pneumoniae vaccination       Relevant Orders   Pneumococcal conjugate vaccine 20-valent (Completed)     Epistaxis            Epistaxis Epistaxis likely due to nasal dryness from CPAP use. Nasal spray effectively maintains moisture. - Continue nasal spray and lubrication to limit further epistaxis  Hypertension Hypertension managed with lisinopril . Dose reduced to 10 mg due to dizziness, which improved. Switch to 10 mg tablet to avoid splitting tab when ready - Switch to 10 mg lisinopril  tablet when current supply is low. - Contact clinic when down to last 10 tablets to order 10 mg lisinopril .  General Health Maintenance Due for pneumonia vaccination. Previous vaccine received eight years ago. Recommended for individuals aged 48 and older. - Administer pneumonia vaccine (Prevnar 20) today.        Orders Placed This Encounter  Procedures   Pneumococcal conjugate vaccine 20-valent    No orders of the defined types were placed in this encounter.   Follow up plan: Return if symptoms worsen or fail to improve.    Marsa Officer, DO [facility] 07/19/2023, 2:32 PM

## 2023-11-07 ENCOUNTER — Encounter: Payer: Self-pay | Admitting: Family Medicine

## 2023-11-07 ENCOUNTER — Ambulatory Visit: Admitting: Family Medicine

## 2023-11-07 ENCOUNTER — Other Ambulatory Visit: Payer: Self-pay | Admitting: Family Medicine

## 2023-11-07 VITALS — BP 124/80 | HR 78 | Ht 72.0 in | Wt 220.2 lb

## 2023-11-07 DIAGNOSIS — R351 Nocturia: Secondary | ICD-10-CM

## 2023-11-07 DIAGNOSIS — G4733 Obstructive sleep apnea (adult) (pediatric): Secondary | ICD-10-CM | POA: Diagnosis not present

## 2023-11-07 DIAGNOSIS — R7303 Prediabetes: Secondary | ICD-10-CM | POA: Diagnosis not present

## 2023-11-07 DIAGNOSIS — I10 Essential (primary) hypertension: Secondary | ICD-10-CM

## 2023-11-07 DIAGNOSIS — E663 Overweight: Secondary | ICD-10-CM

## 2023-11-07 DIAGNOSIS — Z0000 Encounter for general adult medical examination without abnormal findings: Secondary | ICD-10-CM

## 2023-11-07 DIAGNOSIS — E781 Pure hyperglyceridemia: Secondary | ICD-10-CM

## 2023-11-07 LAB — POCT GLYCOSYLATED HEMOGLOBIN (HGB A1C): Hemoglobin A1C: 6.4 % — AB (ref 4.0–5.6)

## 2023-11-07 MED ORDER — LISINOPRIL 10 MG PO TABS: 10.0000 mg | ORAL_TABLET | Freq: Every day | ORAL | 3 refills | Status: AC

## 2023-11-07 NOTE — Patient Instructions (Addendum)
 Thank you for coming to the office today.  Recent Labs    04/26/23 0808 11/07/23 0920  HGBA1C 6.5* 6.4*   Good results on the lab, below range of diabetes. Keep improving diet and lifestyle as you are.  We will check again in 6 months.  DUE for FASTING BLOOD WORK (no food or drink after midnight before the lab appointment, only water or coffee without cream/sugar on the morning of)  SCHEDULE Lab Only visit in the morning at the clinic for lab draw in 6 MONTHS   - Make sure Lab Only appointment is at about 1 week before your next appointment, so that results will be available  For Lab Results, once available within 2-3 days of blood draw, you can can log in to MyChart online to view your results and a brief explanation. Also, we can discuss results at next follow-up visit.   Please schedule a Follow-up Appointment to: Return for 6 month fasting lab > 1 week later Annual Physical.  If you have any other questions or concerns, please feel free to call the office or send a message through MyChart. You may also schedule an earlier appointment if necessary.  Additionally, you may be receiving a survey about your experience at our office within a few days to 1 week by e-mail or mail. We value your feedback.  Marsa Officer, DO [facility], [place]

## 2023-11-07 NOTE — Progress Notes (Signed)
 Subjective:    Patient ID: Kyle Buchanan, male    DOB: [birth year], 63 y.o.   MRN: [id number]  Kyle Buchanan is a 63 y.o. male presenting on 11/07/2023 for Pre-Diabetes   HPI  Discussed the use of AI scribe software for clinical note transcription with the patient, who gave verbal consent to proceed.  History of Present Illness   Kyle Buchanan is a 63 year old male with prediabetes who presents for a six-month follow-up.      Pre-Diabetes / Overweight BMI >29 A1c down to 6.4, previously 6.5 range, at risk of diabetes Now improved diet, more vegetables and reduced sugar diet, increased water Continue regular exercise Meds: Never on med Currently on ACEi Lifestyle: Weight 220 lbs Denies hypoglycemia, polyuria, visual changes, numbness or tingling.   OSA, on CPAP - Patient reports prior history of dx OSA and on CPAP - Today reports that sleep apnea is well controlled. He uses the CPAP machine every night. Tolerates the machine well, and thinks that sleeps better with it and feels good. No new concerns or symptoms. - he will check function of CPAP machine   CHRONIC HTN See above lifestyle Today he is doing well, BP is controlled Taking Lisinopril  10mg  daily now needs new order in past was on 20mg  then taking half Reports good compliance, took meds today. Tolerating well, w/o complaints. Denies CP, dyspnea, HA, edema, dizziness / lightheadedness        Health Maintenance:     Colonoscopy last done 04/10/19, next due 7 years 2028   Future Prevnar-20 at age 77   PSA 0.48 (04/2023) negative.       05/08/2023    9:23 AM 04/26/2022    1:28 PM 07/21/2020   11:17 AM  Depression screen PHQ 2/9  Decreased Interest 0 0 2  Down, Depressed, Hopeless 0 0 0  PHQ - 2 Score 0 0 2  Altered sleeping  0 0  Tired, decreased energy 1 0 0  Change in appetite  0 0  Feeling bad or failure about yourself  0 0 0  Trouble concentrating 0 0 0  Moving slowly or  fidgety/restless 0 0 0  Suicidal thoughts 0 0 0  PHQ-9 Score  0 2  Difficult doing work/chores Not difficult at all Not difficult at all Not difficult at all       05/08/2023    9:23 AM 04/26/2022    1:29 PM 07/21/2020   11:17 AM  GAD 7 : Generalized Anxiety Score  Nervous, Anxious, on Edge 0 0 0  Control/stop worrying 0 0 0  Worry too much - different things 0 0 0  Trouble relaxing 0 0 0  Restless 0 0 0  Easily annoyed or irritable 0 0 0  Afraid - awful might happen 0 0 0  Total GAD 7 Score 0 0 0  Anxiety Difficulty Not difficult at all  Not difficult at all    Social History   Tobacco Use   Smoking status: Former    Current packs/day: 1.00    Average packs/day: 1 pack/day for 9.0 years (9.0 ttl pk-yrs)    Types: Cigarettes    Passive exposure: Past   Smokeless tobacco: Former  Building services engineer status: Never Used  Substance Use Topics   Alcohol use: Not Currently    Comment: past   Drug use: Not Currently    Types: Marijuana, Cocaine    Comment: past  Review of Systems Per HPI unless specifically indicated above     Objective:    BP 124/80 (BP Location: Right Arm, Patient Position: Sitting, Cuff Size: Normal)   Pulse 78   Ht 6' (1.829 m)   Wt 220 lb 4 oz (99.9 kg)   SpO2 97%   BMI 29.87 kg/m   Wt Readings from Last 3 Encounters:  11/07/23 220 lb 4 oz (99.9 kg)  07/19/23 218 lb 2 oz (98.9 kg)  05/08/23 220 lb 2 oz (99.8 kg)    Physical Exam Vitals and nursing note reviewed.  Constitutional:      General: He is not in acute distress.    Appearance: He is well-developed. He is not diaphoretic.     Comments: Well-appearing, comfortable, cooperative  HENT:     Head: Normocephalic and atraumatic.  Eyes:     General:        Right eye: No discharge.        Left eye: No discharge.     Conjunctiva/sclera: Conjunctivae normal.  Neck:     Thyroid: No thyromegaly.  Cardiovascular:     Rate and Rhythm: Normal rate and regular rhythm.     Pulses: Normal  pulses.     Heart sounds: Normal heart sounds. No murmur heard. Pulmonary:     Effort: Pulmonary effort is normal. No respiratory distress.     Breath sounds: Normal breath sounds. No wheezing or rales.  Musculoskeletal:        General: Normal range of motion.     Cervical back: Normal range of motion and neck supple.  Lymphadenopathy:     Cervical: No cervical adenopathy.  Skin:    General: Skin is warm and dry.     Findings: No erythema or rash.  Neurological:     Mental Status: He is alert and oriented to person, place, and time. Mental status is at baseline.  Psychiatric:        Behavior: Behavior normal.     Comments: Well groomed, good eye contact, normal speech and thoughts        Results for orders placed or performed in visit on 11/07/23  POCT HgB A1C   Collection Time: 11/07/23  9:20 AM  Result Value Ref Range   Hemoglobin A1C 6.4 (A) 4.0 - 5.6 %   HbA1c POC (<> result, manual entry)     HbA1c, POC (prediabetic range)     HbA1c, POC (controlled diabetic range)        Assessment & Plan:   Problem List Items Addressed This Visit     Essential hypertension   Relevant Medications   lisinopril  (ZESTRIL ) 10 MG tablet   OSA on CPAP   Overweight (BMI 25.0-29.9)   Pre-diabetes - Primary   Relevant Orders   POCT HgB A1C (Completed)     Prediabetes Prediabetes with HbA1c improved to 6.4 from prior 6.5 at risk. Aim to prevent diabetes progression through lifestyle changes. - Encourage lifestyle modifications including diet and exercise. - No medication, he would prefer oral med option if needed - Reassess HbA1c in 6 months.  Hypertension Hypertension managed with lisinopril  10 mg daily. Emphasized medication adherence. - Refill lisinopril  10 mg daily prescription.  Obstructive sleep apnea OSA on CPAP Obstructive sleep apnea managed with CPAP. Reports improved energy with use. Discussed CPAP machine maintenance. - Contact CPAP provider to address any machine  issues. - Good adherence to CPAP nightly - Continue current CPAP therapy, patient seems to be benefiting from therapy  General Health Maintenance Reviewed vaccinations and cancer screenings. Received flu vaccine. Discussed hepatitis B vaccination record and cancer screening protocols. - Obtain hepatitis B vaccination record. - Schedule follow-up appointment in 6 months.        Orders Placed This Encounter  Procedures   POCT HgB A1C    Meds ordered this encounter  Medications   lisinopril  (ZESTRIL ) 10 MG tablet    Sig: Take 1 tablet (10 mg total) by mouth daily.    Dispense:  90 tablet    Refill:  3    Follow up plan: Return for 6 month fasting lab > 1 week later Annual Physical.  Future labs ordered for 05/06/24  Marsa Officer, DO [facility] 11/07/2023, 9:33 AM

## 2024-02-05 ENCOUNTER — Ambulatory Visit: Admission: RE | Admit: 2024-02-05 | Discharge: 2024-02-05 | Disposition: A | Source: Ambulatory Visit

## 2024-02-05 ENCOUNTER — Ambulatory Visit: Admission: RE | Admit: 2024-02-05 | Discharge: 2024-02-05 | Disposition: A | Source: Home / Self Care

## 2024-02-05 ENCOUNTER — Ambulatory Visit

## 2024-02-05 VITALS — BP 110/80 | HR 71 | Ht 72.0 in | Wt 219.6 lb

## 2024-02-05 DIAGNOSIS — M79641 Pain in right hand: Secondary | ICD-10-CM

## 2024-02-05 DIAGNOSIS — M79643 Pain in unspecified hand: Secondary | ICD-10-CM | POA: Insufficient documentation

## 2024-02-05 NOTE — Progress Notes (Signed)
" ° ° ° °  Acute Patient Visit  Physician: Kyle Menning A Kyle Porto, MD  Patient: Kyle Buchanan MRN: [id number] DOB: 10/14/1960 PCP: Kyle Marsa PARAS, DO     Subjective:   Chief Complaint  Patient presents with   Hand Pain    Patient states something fell on his right hand.     HPI: The patient is a 64 y.o. male who presents today for:   Discussed the use of AI scribe software for clinical note transcription with the patient, who gave verbal consent to proceed.  History of Present Illness   Kyle Buchanan is a 64 year old male who presents with right hand pain following a work-related injury.  Right hand pain - Intermittent pain localized to the proximal aspect of the second and third digits of the right hand - proximal digits.  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   Pain of right hand Yes    No orders of the defined types were placed in this encounter.   Assessment and Plan    Right hand injury, possible fracture Right hand pain in second and third digits, possible small fracture considered. - Ordered x-ray of right hand. - Will follow up with x-ray results for further management.      OBJECTIVE: Vitals:   02/05/24 1306  BP: 110/80  Pulse: 71  SpO2: 98%  Weight: 219 lb 9.6 oz (99.6 kg)  Height: 6' (1.829 m)    Body mass index is 29.78 kg/m.   Physical Exam Vitals  reviewed.  Constitutional:      Appearance: Normal appearance. Well-developed with normal weight.  Cardiovascular:     Rate and Rhythm: Normal rate and regular rhythm. Normal heart sounds. Normal peripheral pulses Hand: Pain with palpation of 2nd proximal digits distal metacarpals.  Normal capillary refill, sensation.  ROM intact , no swelling    Allergies Patient is allergic to amlodipine , flonase  [fluticasone  propionate], loratadine, and cetirizine.  Past Medical History Patient  has a past medical history of Hepatitis C.  Surgical History Patient  has a past surgical history that includes Colonoscopy with propofol  (N/A, 04/10/2019).  Family History Pateint's family history includes Cancer in his father and sister.  Social History Patient  reports that he has quit smoking. His smoking use included cigarettes. He has a 9 pack-year smoking history. He has been exposed to tobacco smoke. He has quit using smokeless tobacco. He reports that he does not currently use alcohol. He reports that he does not currently use drugs after having used the following drugs: Marijuana and Cocaine.    02/05/2024 "

## 2024-02-09 ENCOUNTER — Ambulatory Visit: Payer: Self-pay

## 2024-02-15 ENCOUNTER — Telehealth: Payer: Self-pay

## 2024-02-15 ENCOUNTER — Ambulatory Visit: Payer: Self-pay | Admitting: Family Medicine

## 2024-02-15 DIAGNOSIS — M19041 Primary osteoarthritis, right hand: Secondary | ICD-10-CM

## 2024-02-15 MED ORDER — NAPROXEN 500 MG PO TABS: 500.0000 mg | ORAL_TABLET | Freq: Two times a day (BID) | ORAL | 0 refills | Status: AC

## 2024-02-15 NOTE — Telephone Encounter (Signed)
 Called him back already see other note

## 2024-02-15 NOTE — Telephone Encounter (Signed)
 [employer] and discussed.  It looks like he is following up on a recent visit 10 days ago with Dr JENEANE about R hand pain from injury.  I reviewed her note and the X-ray result. The X-ray shows no injury or fracture but it does show some arthritis.  It is hard for me to know exactly the reason for his pain, but at this point it sounds like a sprain injury or contusion injury. Or could be osteoarthritis  He told me that the injury seems better but now it is mostly both hands ache and hurt. He has tried Naproxen  in past with success. He is aware of risks and precautions avoid prolonged use and take with meal  I will send him Rx for anti inflammatory course with Naproxen  500mg  twice a day 1-2 weeks then AS NEEDED  Marsa Officer, DO [facility] 02/15/2024, 4:08 PM

## 2024-02-15 NOTE — Telephone Encounter (Signed)
 Patient states he did not want to come in for another visit unless his PCP wants to see him for this. He states the pain hasn't gotten any better but hasn't gotten any worse He states that he was wondering if he could get something prescribed possibly for the pain or healing process.   He also states he only wants to see his PCP Dr Edman due to him being familiar with him   FYI Only or Action Required?: Action required by provider: clinical question for provider and update on patient condition.  Patient was last seen in primary care on 02/05/2024 by Zafirov, Clarissa A, MD.  Called Nurse Triage reporting Hand Pain.  Symptoms began almost 2 weeks ago.  Interventions attempted: OTC medications: Tylenol and Rest, hydration, or home remedies.  Symptoms are: unchanged.  Triage Disposition: See PCP When Office is Open (Within 3 Days)  Patient/caregiver understands and will follow disposition?: No, wishes to speak with PCP     Message from Victoria B sent at 02/15/2024  2:54 PM EST  Reason for Triage: Patient has pain in the right hand   Reason for Disposition  [1] MODERATE pain (e.g., interferes with normal activities) AND [2] present > 3 days  Answer Assessment - Initial Assessment Questions Patient called and advised he was seen in the office for right hand pain after an iron pole fell on his hand. He was seen 02/05/2024 Patient did have an Xray Pain is okay but every now and then when working and using my hand--a pain will shoot through sometimes 6 out of 10 pain he states  Patient states the pole hit between his first finger and middle finger and these knuckles feel a little weaker, he describes  Patient takes Tylenol  Patient's Xray Result given to him verbatim: Clarissa A Zafirov, MD 02/09/2024  4:02 PM EST   Please inform patient of test results - hand xr ok.  Fu if other concerns.   Patient states he did not want to come in for another visit unless his PCP  wants to see him for this. He states the pain hasn't gotten any better but hasn't gotten any worse He states that he was wondering if he could get something prescribed possibly for the pain or healing process.   He also states he only wants to see his PCP Dr Edman due to him being familiar with him  Patient is advised to call us  back if anything changes or with any further questions/concerns. Patient is advised that if anything worsens to go to the Emergency Room. Patient verbalized understanding.  Protocols used: Hand Pain-A-AH

## 2024-02-15 NOTE — Telephone Encounter (Signed)
 Copied from CRM #[id number]. Topic: Clinical - Lab/Test Results >> Feb 15, 2024  2:55 PM Victoria B wrote: Reason for CRM: Patient called for imaging results hand. Please cb

## 2024-05-06 ENCOUNTER — Other Ambulatory Visit

## 2024-05-16 ENCOUNTER — Encounter: Admitting: Family Medicine
